# Patient Record
Sex: Female | Born: 1935
Health system: Southern US, Community
[De-identification: ages and names within clinical notes are randomized; demographics above are authoritative.]

## PROBLEM LIST (undated history)

## (undated) DIAGNOSIS — F0390 Unspecified dementia without behavioral disturbance: Secondary | ICD-10-CM

## (undated) DIAGNOSIS — N390 Urinary tract infection, site not specified: Secondary | ICD-10-CM

## (undated) DIAGNOSIS — E785 Hyperlipidemia, unspecified: Secondary | ICD-10-CM

## (undated) DIAGNOSIS — I2510 Atherosclerotic heart disease of native coronary artery without angina pectoris: Secondary | ICD-10-CM

## (undated) DIAGNOSIS — I739 Peripheral vascular disease, unspecified: Principal | ICD-10-CM

## (undated) DIAGNOSIS — I499 Cardiac arrhythmia, unspecified: Secondary | ICD-10-CM

## (undated) DIAGNOSIS — D649 Anemia, unspecified: Secondary | ICD-10-CM

## (undated) DIAGNOSIS — I779 Disorder of arteries and arterioles, unspecified: Secondary | ICD-10-CM

## (undated) DIAGNOSIS — S72142A Displaced intertrochanteric fracture of left femur, initial encounter for closed fracture: Secondary | ICD-10-CM

## (undated) DIAGNOSIS — I080 Rheumatic disorders of both mitral and aortic valves: Secondary | ICD-10-CM

## (undated) DIAGNOSIS — I10 Essential (primary) hypertension: Secondary | ICD-10-CM

## (undated) DIAGNOSIS — A419 Sepsis, unspecified organism: Secondary | ICD-10-CM

## (undated) HISTORY — DX: Sepsis, unspecified organism: N39.0

## (undated) HISTORY — DX: Sepsis, unspecified organism: A41.9

## (undated) HISTORY — DX: Cardiac arrhythmia, unspecified: I49.9

## (undated) HISTORY — DX: Anemia, unspecified: D64.9

## (undated) HISTORY — DX: Rheumatic disorders of both mitral and aortic valves: I08.0

## (undated) HISTORY — DX: Atherosclerotic heart disease of native coronary artery without angina pectoris: I25.10

## (undated) HISTORY — DX: Disorder of arteries and arterioles, unspecified: I77.9

## (undated) HISTORY — DX: Peripheral vascular disease, unspecified: I73.9

## (undated) HISTORY — PX: ABDOMINAL HYSTERECTOMY: SHX81

---

## 2001-05-18 ENCOUNTER — Emergency Department (HOSPITAL_COMMUNITY): Admission: EM | Admit: 2001-05-18 | Discharge: 2001-05-18 | Payer: Self-pay | Admitting: Emergency Medicine

## 2004-01-17 ENCOUNTER — Ambulatory Visit (HOSPITAL_COMMUNITY): Admission: RE | Admit: 2004-01-17 | Discharge: 2004-01-17 | Payer: Self-pay | Admitting: Pulmonary Disease

## 2004-07-07 DIAGNOSIS — I2510 Atherosclerotic heart disease of native coronary artery without angina pectoris: Secondary | ICD-10-CM

## 2004-07-07 HISTORY — DX: Atherosclerotic heart disease of native coronary artery without angina pectoris: I25.10

## 2004-07-07 HISTORY — PX: CORONARY ARTERY BYPASS GRAFT: SHX141

## 2004-11-25 ENCOUNTER — Ambulatory Visit: Payer: Self-pay | Admitting: Cardiology

## 2004-11-28 ENCOUNTER — Ambulatory Visit: Payer: Self-pay | Admitting: Cardiology

## 2004-12-10 ENCOUNTER — Inpatient Hospital Stay (HOSPITAL_COMMUNITY): Admission: AD | Admit: 2004-12-10 | Discharge: 2004-12-17 | Payer: Self-pay | Admitting: *Deleted

## 2004-12-10 ENCOUNTER — Inpatient Hospital Stay (HOSPITAL_BASED_OUTPATIENT_CLINIC_OR_DEPARTMENT_OTHER): Admission: RE | Admit: 2004-12-10 | Discharge: 2004-12-10 | Payer: Self-pay | Admitting: *Deleted

## 2004-12-11 ENCOUNTER — Encounter: Payer: Self-pay | Admitting: Cardiovascular Disease

## 2004-12-11 ENCOUNTER — Ambulatory Visit: Payer: Self-pay | Admitting: Cardiovascular Disease

## 2005-01-02 ENCOUNTER — Ambulatory Visit: Payer: Self-pay | Admitting: Cardiology

## 2005-05-07 ENCOUNTER — Ambulatory Visit: Payer: Self-pay | Admitting: Cardiology

## 2005-11-17 ENCOUNTER — Ambulatory Visit: Payer: Self-pay | Admitting: Cardiology

## 2006-04-06 ENCOUNTER — Ambulatory Visit: Payer: Self-pay | Admitting: Cardiology

## 2007-10-04 ENCOUNTER — Ambulatory Visit: Payer: Self-pay | Admitting: Cardiology

## 2007-10-13 ENCOUNTER — Encounter: Payer: Self-pay | Admitting: Physician Assistant

## 2007-10-13 ENCOUNTER — Encounter: Payer: Self-pay | Admitting: Cardiology

## 2007-10-13 ENCOUNTER — Ambulatory Visit: Payer: Self-pay | Admitting: Cardiology

## 2007-11-11 ENCOUNTER — Encounter: Payer: Self-pay | Admitting: Cardiology

## 2007-11-16 ENCOUNTER — Ambulatory Visit: Payer: Self-pay | Admitting: Cardiology

## 2008-07-25 ENCOUNTER — Ambulatory Visit: Payer: Self-pay | Admitting: Cardiology

## 2008-08-10 ENCOUNTER — Ambulatory Visit: Payer: Self-pay | Admitting: Cardiology

## 2008-08-19 ENCOUNTER — Encounter: Payer: Self-pay | Admitting: Cardiology

## 2008-08-30 ENCOUNTER — Ambulatory Visit: Payer: Self-pay | Admitting: Vascular Surgery

## 2008-11-13 ENCOUNTER — Encounter: Payer: Self-pay | Admitting: Cardiology

## 2008-12-28 ENCOUNTER — Emergency Department (HOSPITAL_COMMUNITY): Admission: EM | Admit: 2008-12-28 | Discharge: 2008-12-28 | Payer: Self-pay | Admitting: Emergency Medicine

## 2009-03-26 DIAGNOSIS — E785 Hyperlipidemia, unspecified: Secondary | ICD-10-CM | POA: Insufficient documentation

## 2009-03-26 DIAGNOSIS — I10 Essential (primary) hypertension: Secondary | ICD-10-CM | POA: Insufficient documentation

## 2009-03-26 DIAGNOSIS — I080 Rheumatic disorders of both mitral and aortic valves: Secondary | ICD-10-CM | POA: Insufficient documentation

## 2009-03-26 DIAGNOSIS — I2510 Atherosclerotic heart disease of native coronary artery without angina pectoris: Secondary | ICD-10-CM

## 2009-03-28 ENCOUNTER — Ambulatory Visit: Payer: Self-pay | Admitting: Vascular Surgery

## 2009-03-29 ENCOUNTER — Encounter: Payer: Self-pay | Admitting: Cardiology

## 2009-04-10 ENCOUNTER — Ambulatory Visit: Payer: Self-pay | Admitting: Cardiology

## 2009-04-10 DIAGNOSIS — I6529 Occlusion and stenosis of unspecified carotid artery: Secondary | ICD-10-CM

## 2009-04-12 ENCOUNTER — Encounter: Payer: Self-pay | Admitting: Cardiology

## 2009-04-13 ENCOUNTER — Encounter: Payer: Self-pay | Admitting: Cardiology

## 2009-04-24 ENCOUNTER — Encounter (INDEPENDENT_AMBULATORY_CARE_PROVIDER_SITE_OTHER): Payer: Self-pay | Admitting: *Deleted

## 2009-08-24 ENCOUNTER — Encounter: Payer: Self-pay | Admitting: Cardiology

## 2009-08-27 ENCOUNTER — Encounter: Payer: Self-pay | Admitting: Cardiology

## 2009-08-28 ENCOUNTER — Encounter: Payer: Self-pay | Admitting: Cardiology

## 2009-08-29 ENCOUNTER — Encounter: Payer: Self-pay | Admitting: Cardiology

## 2010-06-21 ENCOUNTER — Encounter: Payer: Self-pay | Admitting: Cardiology

## 2010-06-21 ENCOUNTER — Encounter: Payer: Self-pay | Admitting: Physician Assistant

## 2010-06-21 ENCOUNTER — Ambulatory Visit: Payer: Self-pay | Admitting: Cardiology

## 2010-06-21 DIAGNOSIS — R5383 Other fatigue: Secondary | ICD-10-CM

## 2010-06-21 DIAGNOSIS — R5381 Other malaise: Secondary | ICD-10-CM | POA: Insufficient documentation

## 2010-06-27 ENCOUNTER — Encounter (INDEPENDENT_AMBULATORY_CARE_PROVIDER_SITE_OTHER): Payer: Self-pay | Admitting: *Deleted

## 2010-07-05 ENCOUNTER — Encounter: Payer: Self-pay | Admitting: Cardiology

## 2010-07-05 ENCOUNTER — Encounter: Payer: Self-pay | Admitting: Physician Assistant

## 2010-07-29 ENCOUNTER — Encounter: Payer: Self-pay | Admitting: Pulmonary Disease

## 2010-08-06 NOTE — Letter (Signed)
Summary: [facility]C DR. Wende Crease  [facility]C DR. Wende Crease   Imported By: Zachary George 04/25/2010 09:31:45  _____________________________________________________________________  External Attachment:    Type:   Image     Comment:   External Document

## 2010-08-08 ENCOUNTER — Ambulatory Visit: Admit: 2010-08-08 | Payer: Self-pay | Admitting: Vascular Surgery

## 2010-08-08 ENCOUNTER — Ambulatory Visit: Payer: Self-pay | Admitting: Vascular Surgery

## 2010-08-08 ENCOUNTER — Other Ambulatory Visit: Payer: Self-pay

## 2010-08-08 NOTE — Letter (Signed)
Summary: Internal Correspondence/ FAXED V VS REQUEST FOR CONSULTATION      Internal Correspondence/ FAXED V VS REQUEST FOR CONSULTATION       Imported By: Dorise Hiss 07/10/2010 09:51:14  _____________________________________________________________________  External Attachment:    Type:   Image     Comment:   External Document

## 2010-08-08 NOTE — Letter (Signed)
Summary: Engineer, materials at [facility]  [address]   [place], [place] [address]   Phone: [phone]  Fax: [phone]        June 27, 2010 MRN: [id number]   Karen Fowler [address] [place], [place]  [address]   Dear [name],  Your test ordered by Selena Batten has been reviewed by your physician (or physician assistant) and was found to be normal or stable. Your physician (or physician assistant) felt no changes were needed at this time.  ____ Echocardiogram  ____ Cardiac Stress Test  __X__ Lab Work  ____ Peripheral vascular study of arms, legs or neck  ____ CT scan or X-ray  ____ Lung or Breathing test  ____ Other:   Thank you.   Hoover Brunette, LPN    Duane Boston, M.D., F.A.C.C. Thressa Sheller, M.D., F.A.C.C. Oneal Grout, M.D., F.A.C.C. Cheree Ditto, M.D., F.A.C.C. Daiva Nakayama, M.D., F.A.C.C. Kenney Houseman, M.D., F.A.C.C. Jeanne Ivan, PA-C

## 2010-08-08 NOTE — Assessment & Plan Note (Signed)
Summary: 6 month fu has dementia   Visit Type:  Follow-up Primary Karen Fowler:  Karen Fowler   History of Present Illness: patient returns to the office for overdue six-month followup. She was last seen here in October 2010, by Dr. Andee Lineman. She presents with her daughter, who lives in [place], and she was concerned that her mother has not had regular medical followup. Of note, however, she was just seen, approximately 2 weeks ago, by her primary care physician, Dr. Juanetta Gosling, in [place]. No labs were ordered.  Clinically, patient denies any chest pain or tachycardia palpitations. Her daughter is concerned that her mother easily "gets tired", and has become much more bedridden lately, or just sitting in a wheelchair. She does not venture out of her home. She appears to have some DOE, when walking with her walker, but otherwise denies orthopnea, PND, or lower extremity edema.      Preventive Screening-Counseling & Management  Alcohol-Tobacco     Smoking Status: quit     Year Quit: 1960  Current Medications (verified): 1)  Simvastatin 20 Mg Tabs (Simvastatin) .... Take One Tablet By Mouth Daily At Bedtime 2)  Lasix 20 Mg Tabs (Furosemide) .... Take 1 Tablet By Mouth Once A Day 3)  Lisinopril 20 Mg Tabs (Lisinopril) .Marland Kitchen.. 1 Tablet By Mouth Once A Day 4)  Nitrostat 0.4 Mg Subl (Nitroglycerin) .... Place 1 Tablet Under Tongue As Directed 5)  Potassium Chloride Cr 10 Meq Cr-Caps (Potassium Chloride) .... Take 1 Capsule By Mouth Once A Day 6)  Aspirin 81 Mg Tbec (Aspirin) .... Take 1 Tablet By Mouth Once A Day 7)  Metoprolol Tartrate 25 Mg Tabs (Metoprolol Tartrate) .... Take 1 Tablet By Mouth Once A Day 8)  Amlodipine Besylate 5 Mg Tabs (Amlodipine Besylate) .... Take 1 Tablet By Mouth Once A Day 9)  Namenda 10 Mg Tabs (Memantine Hcl) .... Take 1 Tablet By Mouth Two Times A Day 10)  Oscal 500/200 D-3 500-200 Mg-Unit Tabs (Calcium Carbonate-Vitamin D) .... Take 1 Tablet By Mouth Once  A Day  Allergies (verified): 1)  ! Pcn  Comments:  Nurse/Medical Assistant: The patient's medication bottles and allergies were reviewed with the patient and were updated in the Medication and Allergy Lists.  Past History:  Past Medical History: Last updated: 04/10/2009 MITRAL REGURGITATION, 0 (MILD) (ICD-396.3) HYPERTENSION, UNSPECIFIED (ICD-401.9) HYPERLIPIDEMIA-MIXED (ICD-272.4) CAD, ARTERY BYPASS GRAFT (ICD-414.04) Dementia Mild cerebrovascular disease 1. Multivessel coronary artery disease.     a.     Status post coronary artery bypass graft in 2006.     b.     Normal LV function. 2. Mild cerebrovascular disease.     a.     EF was 60%, left ventricular artery stenosis in May 2007. 3. History of mild mitral regurgitation. 4. Hypertension. 5. Dyslipidemia. 6. Dementia.   Social History: Smoking Status:  quit  Review of Systems       No fevers, chills, hemoptysis, dysphagia, melena, hematocheezia, hematuria, rash, claudication, orthopnea, pnd, pedal edema. All other systems negative.   Vital Signs:  Patient profile:   77 year old female Height:      64 inches Weight:      151 pounds BMI:     26.01 Pulse rate:   69 / minute BP sitting:   134 / 72  (left arm) Cuff size:   large  Vitals Entered By: Carlye Grippe (June 21, 2010 1:49 PM)  Nutrition Counseling: Patient's BMI is greater than 25 and therefore counseled on weight  management options.  Physical Exam  Additional Exam:  GEN: 13-year-old female, no distress HEENT: NCAT,PERRLA,EOMI NECK: palpable pulses, no bruits; no JVD; no TM LUNGS: CTA bilaterally HEART: RRR (S1S2); no significant murmurs; no rubs; no gallops ABD: soft, NT; intact BS EXT: intact distal pulses; trace-1+ edema SKIN: warm, dry MUSC: no obvious deformity NEURO: A/O (x3)     EKG  Procedure date:  06/21/2010  Findings:      sinus arrhythmia at 65 bpm; normal axis; nonspecific ST changes  Impression &  Recommendations:  Problem # 1:  CAD, ARTERY BYPASS GRAFT (ICD-414.04)  clinically stable, with no current indication of unstable angina pectoris. Last ischemic evaluation was a negative stress test in 2010.  Problem # 2:  CAROTID ARTERY DISEASE (ICD-433.10)  Will arrange a return followup appointment with Dr. Fabienne Bruns in [place], who she last saw in February 2010. Her last carotid Doppler study was in September of that year, revealing 60-79% left ICA, and 40-59% right ICA stenosis. Will defer to him for ongoing monitoring and treatment recommendations.  Orders: EKG w/ Interpretation (93000) T-Comprehensive Metabolic Panel (53664-40347) T-CBC No Diff ([id number]) T-TSH (64332-95188)  Problem # 3:  FATIGUE (ICD-780.79)  Will order extensive blood work with complete metabolic profile, CBC, TSH level.  Problem # 4:  HYPERLIPIDEMIA-MIXED (ICD-272.4)  reassessment of lipid status with FLP/LFTs profile. Of note, patient is on low-dose simvastatin, in conjunction with low dose amlodipine. We'll make final recommendation regarding possible substitution with another statin, following review of her lipid profile.  Problem # 5:  HYPERTENSION, UNSPECIFIED (ICD-401.9) Assessment: Comment Only  Her updated medication list for this problem includes:    Lasix 20 Mg Tabs (Furosemide) .Marland Kitchen... Take 1 tablet by mouth once a day    Lisinopril 20 Mg Tabs (Lisinopril) .Marland Kitchen... 1 tablet by mouth once a day    Aspirin 81 Mg Tbec (Aspirin) .Marland Kitchen... Take 1 tablet by mouth once a day    Metoprolol Tartrate 25 Mg Tabs (Metoprolol tartrate) .Marland Kitchen... Take 1 tablet by mouth once a day    Amlodipine Besylate 5 Mg Tabs (Amlodipine besylate) .Marland Kitchen... Take 1 tablet by mouth once a day  Orders: T-Comprehensive Metabolic Panel [phone]) T-CBC No Diff ([id number]) T-TSH ([id number])  Patient Instructions: 1)  Dr. Fabienne Bruns - follow up appointment for carotid artery disease 2)  Labs:  CBC, CBC, TSH 3)   Follow up in  4 months Prescriptions: POTASSIUM CHLORIDE CR 10 MEQ CR-CAPS (POTASSIUM CHLORIDE) Take 1 capsule by mouth once a day  #30 Each x 6   Entered by:   Hoover Brunette, LPN   Authorized by:   Lewayne Bunting, MD, [facility]   Signed by:   Hoover Brunette, LPN on 62/37/6283   Method used:   Electronically to        [facility] # [phone]* (retail)       [address]       [place], [place]  [address]       Ph: [phone] or [phone]       Fax: [phone]   RxID:   [id number] LISINOPRIL 20 MG TABS (LISINOPRIL) 1 tablet by mouth once a day  #30 Each x 6   Entered by:   Hoover Brunette, LPN   Authorized by:   Lewayne Bunting, MD, [facility]   Signed by:   Hoover Brunette, LPN on 25/85/2778   Method used:   Electronically to        [facility]  [address] # [phone]* (retail)       [address]       [place], [place]  [address]       Ph: [phone] or [phone]       Fax: [phone]   RxID:   [id number] LASIX 20 MG TABS (FUROSEMIDE) Take 1 tablet by mouth once a day  #30 Each x 6   Entered by:   Hoover Brunette, LPN   Authorized by:   Lewayne Bunting, MD, [facility]   Signed by:   Hoover Brunette, LPN on 36/64/4034   Method used:   Electronically to        [facility] # [phone]* (retail)       [address]       [place], [place]  [address]       Ph: [phone] or [phone]       Fax: [phone]   RxID:   [id number] SIMVASTATIN 20 MG TABS (SIMVASTATIN) Take one tablet by mouth daily at bedtime  #30 x 6   Entered by:   Hoover Brunette, LPN   Authorized by:   Lewayne Bunting, MD, [facility]   Signed by:   Hoover Brunette, LPN on 62/83/1517   Method used:   Electronically to        [facility] # [phone]* (retail)       [address]       [place], [place]  [address]       Ph: [phone] or [phone]       Fax: [phone]   RxID:   [id number]

## 2010-08-08 NOTE — Miscellaneous (Signed)
Summary: Orders Update - [facility]  Clinical Lists Changes  Orders: Added new Referral order of [facility] Referral ([facility] Ref) - Signed 

## 2010-08-08 NOTE — Letter (Signed)
Summary: [facility] D/C DR. Wende Crease  [facility]C DR. Wende Crease   Imported By: Zachary George 06/21/2010 14:15:48  _____________________________________________________________________  External Attachment:    Type:   Image     Comment:   External Document

## 2010-11-19 NOTE — Assessment & Plan Note (Signed)
[facility]                          [facility] OFFICE NOTE   Karen, Fowler                    MRN:          [id number]  DATE:10/04/2007                            DOB:          1926/01/17    PRIMARY CARDIOLOGIST:  Learta Codding, MD   REASON FOR VISIT:  Annual follow-up.   Karen Fowler returns to our clinic after being last seen here in October  2007.  She has a history of multivessel coronary artery disease, status  post CABG in June 2006.  She was treated for postop PAF with amiodarone,  which was stopped by Dr. Andee Lineman during a postop follow-up here in  November 2006.  She was not discharged on Coumadin, however.   Clinically, Karen Fowler denies any symptoms of chest pain, congestive  heart failure, or tachypalpitations.   I had previously noted that she would be due for surveillance carotid  Dopplers in 1 year.  I also noted that she was not on a lipid-lowering  agent at that time.  On her part, however, the patient also did not  follow up with [place] in 1 year, as I had recommended.   My only medication adjustment at time of her last visit was to cut back  on aspirin to 81 mg daily.   Electrocardiogram today reveals sinus bradycardia at 57 bpm with normal  axis and nonspecific ST abnormalities.   CURRENT MEDICATIONS:  1. Metoprolol 25 mg b.i.d.  2. Lisinopril 20 mg daily.  3. Furosemide 20 mg daily.  4. Potassium chloride 10 mEq daily.  5. Xanax 0.5 mg nightly.  6. Aspirin 81 mg daily.  7. Aricept 10 mg daily.   The patient was recently instructed to present in follow-up, given that  she needed refill of her furosemide.   PHYSICAL EXAMINATION:  Blood pressure 128/73, pulse 57, regular, weight  137.  GENERAL:  A 77 year old female sitting upright in no distress.  HEENT:  Normocephalic, atraumatic.  NECK:  Palpable carotid pulses with soft bilateral bruits (left greater  right); no JVD.  LUNGS:  Clear to auscultation in  all fields.  HEART:  Regular rate and rhythm (S1, s2), no significant murmurs.  ABDOMEN:  Soft, nontender, with intact bowel sounds.  EXTREMITIES:  Palpable pulses with no pedal edema.  NEUROLOGIC: Flat affect, but no focal deficits.   IMPRESSION:  1. Multivessel coronary artery disease.      a.     Status post four-vessel coronary artery bypass graft, June       2006.      b.     Normal LVF by cardiac catheterization.      c.     Postoperative paroxysmal atrial fibrillation, treated with       amiodarone.  2. Mild cerebrovascular disease.      a.     A 40-60% left internal carotid artery stenosis, May 2007.  3. History of mild mitral regurgitation.  4. Hypertension.  5. Hyperlipidemia, not on a statin.  6. Early dementia.   PLAN:  1. Continue current medication regimen, except for Lasix.  The patient  presents with no clinical symptoms suggestive of congestive heart      failure.  She also has no history of lower extremity edema and has      known normal LVF by cardiac catheterization.  She is to also stop      taking supplemental potassium, as well.  2. A 2-D echocardiogram for reassessment of left ventricular function,      but primarily for reassessment of severity of mitral regurgitation.      Her cardiac catheterization suggested that this was moderate.      However, discharge records indicate that a 2-D echocardiogram,      prior to bypass surgery, showed no significant mitral      regurgitation.  3. Surveillance carotid Dopplers.  Once again, she has evidence of      soft bilateral bruits by physical examination.  Her last study was      in May 2007, showing mild to moderate disease on the left.  4. Initiate lipid-lowering treatment with simvastatin 40 mg nightly.      The patient will need a baseline FLP and a repeat study in 12      weeks.  5. Schedule return clinic follow-up with myself and Dr. Andee Lineman in 1      month, for review of study results and further  recommendations.  Of      note, the patient is now nearly 3 years out from undergoing      surgical revascularization.  She has not had any subsequent      ischemic workup but, in the absence of any current complaint of      clinical symptoms, I am disinclined to order a stress test at this      time.  We will review this when she returns for her scheduled      follow-up.     Gene Serpe, PA-C  Electronically Signed      Learta Codding, MD,FACC  Electronically Signed   GS/MedQ  DD: 10/04/2007  DT: 10/05/2007  Job #: [id number]   cc:   Ramon Dredge L. Juanetta Gosling, M.D.

## 2010-11-19 NOTE — Assessment & Plan Note (Signed)
OFFICE VISIT   Karen Fowler, Karen Fowler  DOB:  Mar 10, 1926                                       08/30/2008  ZOXWR#:[id number]   The patient is an 77 year old female referred by Dr. Andee Lineman for  asymptomatic carotid stenosis.  She denies any history of stroke, TIA or  amaurosis.  She does have a history of mild to moderate dementia.   Her atherosclerotic risk factors include coronary artery disease.  She  underwent coronary bypass grafting in 2006.  She also has a history of  hypertension and elevated cholesterol.  She denies tobacco use.   PAST SURGICAL HISTORY:  Remarkable for coronary artery bypass grafting  in 2006.  She had a hysterectomy.   FAMILY HISTORY:  Unremarkable.   SOCIAL HISTORY:  She is widowed.  She is a nonsmoker, non-consumer of  alcohol.   REVIEW OF SYSTEMS:  She has had some loss of appetite and weight loss.  She has had some loss of appetite and weight loss recently.  However,  the patient denies any pain with eating and states that she just has not  been hungry.  CARDIAC:  She has dyspnea with exertion.  PULMONARY/GI/VASCULAR/NEUROLOGIC/HEMATOLOGIC:  Are otherwise negative.  RENAL:  She has urinary frequency.  ORTHOPEDIC:  She has multiple joint arthritis pain.  PSYCHIATRIC:  She has some mild anxiety and depression.  ENT:  She has had some decline in her eyesight recently.   MEDICATIONS:  1. Aricept 10 mg once a day.  2. Furosemide 20 mg once a day.  3. Amlodipine 5 mg once a day.  4. Potassium chloride 10 mEq once a day.  5. Namenda 5 mg two times a day.  6. Simvastatin 20 mg nightly.  7. Lisinopril 20 mg once a day.  8. Metoprolol 25 mg 2 daily.  9. Premarin 0.65 mg once a day.   She is allergic to penicillin, which causes a rash.   PHYSICAL EXAM:  Vital Signs:  Blood pressure is 117/66 in the left arm,  133/64 in the right arm, pulse is 60 and regular.  HEENT:  Unremarkable.  Neck:  Has 2+ carotid pulses.  There is a faint  left carotid bruit.  Chest:  Clear to auscultation.  Cardiac:  Regular rate and rhythm  without murmur.  Abdomen:  Soft, nontender, nondistended with no mass.  Extremities:  She has 2+ femoral and 1+ popliteal pulses bilaterally.  She has no palpable pedal pulses.  She has no ulcerations on the feet.   She had a carotid duplex exam performed at [facility] on August 10, 2008.  This is reviewed today.  Peak systolic velocity on the left  side was 226 cm/s.  On the right side it was 130 cm/s.  This corresponds  to a 60% to 80% stenosis on the left and a 40% to 60% stenosis on the  right.  She had antegrade vertebral flow bilaterally.   In summary, the patient has evidence of a moderate left carotid artery  stenosis and a mild right carotid artery stenosis.  These both appear to  be asymptomatic.  I believe the best management for this is continued  risk factor modification with aspirin therapy and risk reduction of her  elevated cholesterol and hypertension and management of her coronary  artery disease.  She will need a repeat  carotid duplex exam in 6 months'  time to see if she has had any progression of the lesion.  If she  develops any symptoms of TIA, amaurosis or stroke prior to this, she  will return sooner for follow-up.  This was discussed with the patient's  daughters today.   Janetta Hora. Fields, MD  Electronically Signed   CEF/MEDQ  D:  08/31/2008  T:  08/31/2008  Job:  [id number]   cc:   Learta Codding, MD,FACC

## 2010-11-19 NOTE — Assessment & Plan Note (Signed)
[facility]                          [facility] OFFICE NOTE   Karen Fowler, Karen Fowler                    MRN:          [id number]  DATE:11/16/2007                            DOB:          07-05-1926    REFERRING PHYSICIAN:  Ramon Dredge L. Juanetta Gosling, M.D.   HISTORY OF PRESENT ILLNESS:  The patient is a 77 year old female with a  history of coronary artery disease status post coronary bypass graft.  The patient has normal LV function.  She has a history of postoperative  paroxysmal atrial fibrillation previously treated with amiodarone but  now discontinued.  The patient has been doing well.  She reports no  chest pain or shortness of breath.  She did have some lower extremity  edema which required her to go to the emergency room.  She was off taken  off Lasix by Gene Serpe recently, likely contributing to her worsening  edema.  She also had a follow-up echocardiogram done to rule out a  significant mitral regurgitation.  MR was basically undetectable.  Her  LV function was normal.  The patient is now doing well and her edema has  improved.  She was felt have also cellulitis and was given 2  antibiotics, cephalexin and sulfamethoxazole.   MEDICATIONS:  Simvastatin, cephalexin, sulfamethoxazole, piroxicam,  potassium, Premarin, Norvasc, metoprolol, lisinopril,  Xanax,  multivitamin, B12, Os-Cal, aspirin, Aricept.   PHYSICAL EXAMINATION:  VITAL SIGNS:  Blood pressure 123/61, heart rate  56, 140 pounds.  NECK EXAM:  Normal carotid upstroke.  No carotid bruits.  LUNGS:  Clear breath sounds bilaterally.  HEART:  Regular rate and rhythm.  Normal S1 and S2.  No murmurs, rubs or  gallops.  ABDOMEN:  Soft, nontender.  No rebound or guarding.  Good bowel sounds.  EXTREMITY EXAM:  No cyanosis, clubbing or edema.   PROBLEM LIST:  1. Multivessel coronary disease.      a.     Status post coronary artery bypass grafting.      b.     Normal left ventricular  function.  2. Mild cerebrovascular disease.      a.     40% to 60% left internal carotid artery stenosis in May of       2007.  Still less than 70% in followup.  3. History of mild mitral regurgitation, resolved.  4. Hypertension.  5. Dyslipidemia.  Now on statin therapy.  6. Dementia.   PLAN:  1. The patient is doing remarkably well.  Will start her back on Lasix      20 mg p.o. daily.  2. I have discussed with her the results of her carotid Doppler study      and echocardiogram      study.  3. The patient can in follow up with [place] in 6 months.     Karen Codding, MD,FACC  Electronically Signed    GED/MedQ  DD: 11/16/2007  DT: 11/16/2007  Job #: [id number]   cc:   Ramon Dredge L. Juanetta Gosling, M.D.

## 2010-11-19 NOTE — Procedures (Signed)
CAROTID DUPLEX EXAM   INDICATION:  Followup carotid artery disease.   HISTORY:  Diabetes:  No.  Cardiac:  CABG.  Hypertension:  No.  Smoking:  No.  Previous Surgery:  Open heart surgery.  CV History:  Asymptomatic.  Amaurosis Fugax No, Paresthesias No, Hemiparesis No                                       RIGHT               LEFT  Brachial systolic pressure:         130                 122  Brachial Doppler waveforms:         WNL                 WNL  Vertebral direction of flow:        Antegrade           Antegrade  DUPLEX VELOCITIES (cm/sec)  CCA peak systolic                   115                 101  ECA peak systolic                   153                 571  ICA peak systolic                   153                 235  ICA end diastolic                   45                  52  PLAQUE MORPHOLOGY:                  Calcified           Calcified  PLAQUE AMOUNT:                      Moderate            Moderate/severe  PLAQUE LOCATION:                    ICA/ECA/bifurcation ICA/ECA/CCA   IMPRESSION:  1. Right internal carotid artery shows evidence of 40-59% stenosis.  2. Left internal carotid artery shows evidence of 60-79% stenosis.  3. Left external carotid artery high-grade stenosis.  4. Internal carotid artery velocities appear stable from study done      08/10/2008 at different facility.   ___________________________________________  Janetta Hora. Fields, MD   AS/MEDQ  D:  03/28/2009  T:  03/29/2009  Job:  [id number]

## 2010-11-19 NOTE — Assessment & Plan Note (Signed)
[facility]                          [facility]   Karen Fowler, Karen Fowler                    MRN:          [id number]  DATE:07/25/2008                            DOB:          October 16, 1925    HISTORY OF PRESENT ILLNESS:  This patient is an 77 year old female with  history of coronary artery disease status post coronary artery bypass  grafting in 2006.  The patient is doing remarkably well.  She reports no  chest pain, shortness of breath, orthopnea, or PND.  She has no syncope  or palpitations.  There is apparently some mitral regurgitation, but  this was early not detected by her physical examination and the last  echo also was found to be mild.   MEDICATIONS:  1. Simvastatin 20 mg p.o. daily.  2. Namenda 5 mg p.o. b.i.d.  3. Premarin 0.625 mg daily.  4. Aricept 10 mg p.o. daily.  5. Lisinopril 20 mg p.o. daily.  6. Metoprolol 25 mg p.o. b.i.d.  7. Amlodipine 5 mg a day.  8. Potassium 10 mg p.o. daily.  9. Furosemide 20 mg p.o. daily.  10.Aspirin 81 mg p.o. daily.   PHYSICAL EXAMINATION:  VITAL SIGNS:  Blood pressure is 118/60, heart  rate 54, and weight is 140 pounds.  NECK:  Normal carotid upstroke.  Soft bilateral carotid bruits.  LUNGS:  Clear breath sounds bilaterally.  HEART:  Regular rate and rhythm.  Normal S1, S2.  No murmur, rubs, or  gallops.  ABDOMEN:  Soft.  EXTREMITIES:  No cyanosis, clubbing, or edema.   PROBLEMS:  1. Multivessel coronary artery disease.      a.     Status post coronary artery bypass graft in 2006.      b.     Normal LV function.  2. Mild cerebrovascular disease.      a.     EF was 60%, left ventricular artery stenosis in May 2007.  3. History of mild mitral regurgitation.  4. Hypertension.  5. Dyslipidemia.  6. Dementia.   PLAN:  1. The patient is due for a followup Cardiolite study, it has been      more than 3 years after her surgery, we will schedule this.  2. The patient also need  to follow up carotid Dopplers.  3. From the medication perspective no changes have been made, and      blood pressure is well controlled.  Lipid panel can be followed by      Dr. Juanetta Gosling.     Learta Codding, MD,FACC  Electronically Signed    GED/MedQ  DD: 07/25/2008  DT: 07/26/2008  Job #: [id number]   cc:   Ramon Dredge L. Juanetta Gosling, M.D.

## 2010-11-19 NOTE — Assessment & Plan Note (Signed)
[facility]                          [facility]   Karen Fowler, Karen Fowler                    MRN:          [id number]  DATE:10/04/2007                            DOB:          Dec 03, 1925    DATE OF VISIT:  October 04, 2007.  DICTATION ENDED HERE     Gene Serpe, PA-C     GS/MedQ  DD: 10/04/2007  DT: 10/04/2007  Job #: [id number]

## 2010-11-22 NOTE — Op Note (Signed)
NAMEDEZHANE, STATEN NO.:  [id number]   MEDICAL RECORD NO.:  [id number]          PATIENT TYPE:  INP   LOCATION:  2304                         FACILITY:  [facility]   PHYSICIAN:  Kaylyn Layer. Michelle Piper, M.D.   DATE OF BIRTH:  03/17/1926   DATE OF PROCEDURE:  12/12/2004  DATE OF DISCHARGE:                                 OPERATIVE REPORT   TYPE OF REPORT:  Transesophageal echocardiographic probe placement and  monitoring during coronary artery bypass grafting.   SURGEON:  Kaylyn Layer. Michelle Piper, M.D.   BODY OF REPORT:  I was consulted by Dr. Cornelius Moras to place the transesophageal  Echo probe into Ms. Brooking for evaluation and monitoring of her mitral  regurgitation during coronary artery bypass grafting.   After uneventful induction of anesthesia and endotracheal intubation, the  transesophageal Echo probe was easily placed into the stomach.  Initial  short axis view of the left ventricle showed normal left ventricle in all  quadrants with no wall motion abnormalities.  Turning the four-chamber view,  I could visualize the mitral valve well.  The valve tips coapted normally,  and valve structures looked normal in all views, and on placing Colorflow  Doppler across the valve, two distinct jets of 1-2+ mitral regurgitation  could be seen, although best seen in the 30-degree view and the 160-degree  view.  The left atrium was normal size, however.  Flow into the pulmonary  veins was forward.  The intra-atrial septum was normal.  Turning to the  aortic valve, the left ventricular outflow tract was normal.  The aortic  valve was trileaflet.  On placing the Colorflow Doppler across the valve in  the longitudinal view, there was no evidence of aortic insufficiency or  aortic stenosis.  The right side of the heart was normal.  There was a trace  of tricuspid regurgitation with the pulmonary artery catheter present.   The patient was placed on cardiopulmonary bypass by Dr. Cornelius Moras and underwent  coronary artery bypass grafting.  Immediately prior to discontinuation of  cardiopulmonary bypass, the ventricle looked unchanged.  There were no wall  motion abnormalities.  Cardiopulmonary bypass was discontinued using minimal  inotropes, and short axis view of the left ventricle showed unchanged from  preoperatively, normal left ventricle with no wall motion abnormalities.  Turning to the mitral valve, structurally it looked normal.  On placing  Colorflow Doppler across the valve, there was about 1+ mitral regurgitation  with seemingly some improvement as opposed to preoperatively.  Once again,  two distinct jets could be seen in the 30-degree and the 160-degree views  best.  The left atrium was normal, and the rest of the exam was unchanged  from preoperatively.   At the end of the procedure, the transesophageal Echo probe was removed from  the patient uneventfully, and she was taken to the intensive care unit  intubated and in stable condition.      KDO/MEDQ  D:  12/12/2004  T:  12/12/2004  Job:  [id number]   cc:   Anesthesia Department

## 2010-11-22 NOTE — Assessment & Plan Note (Signed)
[facility]                            [facility]   MEGGEN, SPAZIANI                    MRN:          [id number]  DATE:04/06/2006                            DOB:          22-Nov-1925    REASON FOR VISIT:  A scheduled six-month followup.   HISTORY OF PRESENT ILLNESS:  Ms. Karen Fowler is a 78 year old female, status  post coronary artery bypass graft surgery with a history of postoperative  atrial fibrillation and mild cerebrovascular disease, who now returns for a  scheduled followup.   Since last seen here in the clinic in May of this year, the patient reports  no interim development of any signs/symptoms suggestive of unstable angina  pectoris or congestive heart failure.  She also denies any tachy  palpitations.  The patient states that she feels good.  She lives alone,  but has assistance via her grandchildren, who see to it that she takes her  daily medications.   The patient denies any history of tobacco smoking.   CURRENT MEDICATIONS:  1. Coated aspirin 325 mg daily.  2. Furosemide 20 mg daily.  3. Kay-Ciel 10 mg daily.  4. Xanax 0.5 mg q.h.s.  5. Lisinopril 20 mg daily.  6. Metoprolol 25 mg b.i.d.  7. Norvasc 5 mg daily.  8. Premarin.   PHYSICAL EXAMINATION:  VITAL SIGNS:  Blood pressure 140/58, pulse 58 and  regular.  Weight 151 pounds.  NECK:  Soft, bilateral carotid bruits, but otherwise intact pulses.  LUNGS:  Clear to auscultation in all fields.  HEART:  A regular rate and rhythm.  S1 and S2.  No significant murmurs.  ABDOMEN:  Soft, nontender.  EXTREMITIES:  Palpable distal pulses with trace pedal edema.  NEUROLOGIC:  A flat affect but no focal deficits.   IMPRESSION:  1. Coronary artery disease - stable      a.     Four-vessel coronary artery bypass graft surgery in June 2006.      b.     Normal left ventricular function by catheterization.      c.     Postoperative paroxysmal atrial fibrillation.  2.  Hypertension.  3. Hyperlipidemia      a.     Followed by Dr. Ramon Dredge L. Hawkins.  4. Mild cerebrovascular disease      a.     A 40%-60% left internal carotid artery stenosis in May 2007.  5. History of mild mitral regurgitation.   PLAN:  I instructed [name] to remain on her current medication regimen,  but to cut back on aspirin to 81 mg daily as a maintenance dose.  Of note,  she is currently not on a lipid-lowering agent.  I will defer to Dr. Juanetta Gosling  regarding whether or not she to initiate this, with a recommended LDL goal  of 70 or less.   We will plan on having the patient resume clinic followup with Dr. Learta Codding in one year.  The patient will also need repeat carotid Dopplers in  approximately one year as well.      ______________________________  Gene  Serpe, PA-C    ______________________________  Learta Codding, MD,FACC    GS/MedQ  DD:  04/06/2006  DT:  04/07/2006  Job #:  [id number]   cc:   Ramon Dredge L. Juanetta Gosling, M.D.

## 2010-11-22 NOTE — Cardiovascular Report (Signed)
NAMEMarland Kitchen  Karen Fowler, Karen Fowler NO.:  [id number]   MEDICAL RECORD NO.:  [id number]          PATIENT TYPE:  OIB   LOCATION:  6501                         FACILITY:  [facility]   PHYSICIAN:  Carole Binning, M.D. LHCDATE OF BIRTH:  05-Jun-1926   DATE OF PROCEDURE:  12/10/2004  DATE OF DISCHARGE:                              CARDIAC CATHETERIZATION   PROCEDURE PERFORMED:  Left heart catheterization with coronary angiography  and left ventriculography.   INDICATION:  The patient is a 77 year old woman who recently presented to  [facility] with prolonged chest pain. She ruled out for myocardial  infarction. A stress nuclear study revealed inferior ischemia. She was thus  referred for an outpatient cardiac catheterization.   PROCEDURE NOTE:  A 4-French sheath was placed in the right femoral artery.  Coronary angiography was performed with standard Judkins 4-French catheters.  Left ventriculography was performed with an angled pigtail catheter.  Contrast was Omnipaque. Of note, the patient was complaining of left arm  pain prior to beginning the catheterization. This persisted throughout the  catheterization and was unchanged at the end of the procedure. There were no  complications.   RESULTS:  A. Hemodynamics: Left ventricular pressure 168/9, aortic pressure  162/62. There is no aortic valve gradient.  B. Left ventriculogram: Wall motion is normal, ejection fraction estimated  60%. There is 2-3+ moderate mitral regurgitation.  C. Coronary arteriography:  1.  The left main is very short with dual ostia at the left anterior      descending artery and the left circumflex coronary artery. There is      moderate calcification in the proximal portion of these vessels.  2.  Left anterior descending artery has a 75% stenosis in the proximal      vessel, 80% in the mid vessel, and 80% stenosis in the distal vessel.      The LAD gives rise to a large diagonal branch. In the  proximal portion      of this diagonal branch, there is a 90% stenosis, and in the distal      portion, there is a 90% stenosis.  3.  The left circumflex is a large codominant vessel. In the distal      circumflex, there is a diffuse 70% stenosis extending across the origin      of a large third obtuse marginal branch. The circumflex gives rise to      small first obtuse marginal, a normal-sized second obtuse marginal      branch, and a large third obtuse marginal branch. There is an 80%      stenosis in the ostium of the third obtuse marginal branch. The distal      circumflex also gives rise to a small posterolateral branch.  4.  The right coronary artery is a codominant vessel. There is a 99%      stenosis in the ostium of the right coronary artery with heavy      calcification at that site. In the proximal mid vessel, there is a      diffuse 70% stenosis. In the distal vessel, there  is a diffuse 20%      stenosis. The distal right coronary artery ends as a small bifurcating      posterior descending artery.   IMPRESSION:  1.  Normal left ventricular systolic function with moderate mitral      regurgitation.  2.  Three-vessel coronary artery disease with particularly critical disease      in the ostium of the right coronary artery.   PLAN:  The patient will be admitted to the hospital and started on  intravenous heparin and nitroglycerin. We will obtain an echocardiogram to  further assess her mitral regurgitation. Cardiovascular surgery will be  consulted to evaluate for coronary artery bypass surgery.       MWP/MEDQ  D:  12/10/2004  T:  12/10/2004  Job:  [id number]   cc:   Ramon Dredge L. Juanetta Gosling, M.D.  [address]  [place]  [place] [address]  Fax: [phone]   [facility]  [place], [place]

## 2010-11-22 NOTE — Discharge Summary (Signed)
NAMECHEVI, LIM NO.:  [id number]   MEDICAL RECORD NO.:  [id number]          PATIENT TYPE:  INP   LOCATION:  2017                         FACILITY:  [facility]   PHYSICIAN:  Salvatore Decent. Cornelius Moras, M.D. DATE OF BIRTH:  Mar 26, 1926   DATE OF ADMISSION:  12/10/2004  DATE OF DISCHARGE:  12/17/2004                                 DISCHARGE SUMMARY   PRIMARY ADMITTING DIAGNOSIS:  Chest pain/   ADDITIONAL/DISCHARGE DIAGNOSES:  1.  Severe three-vessel coronary artery disease  2.  Mild mitral regurgitation.  3.  Hypertension.  4.  Hyperlipidemia.  5.  Postoperative atrial fibrillation.  6.  Right forearm thrombophlebitis.   PROCEDURES PERFORMED:  1.  Cardiac catheterization.  2.  Coronary artery bypass grafting x 4 (left internal mammary artery to the      distal LAD, saphenous vein graft to the first diagonal, saphenous vein      graft to the second circumflex marginal, saphenous vein graft to the      distal right coronary artery).  3.  Endoscopic vein harvest from bilateral thighs.   HISTORY:  The patient is a 77 year old female with no previous history of  coronary artery disease. Around two weeks prior to this admission, she  developed a sustained episode of substernal chest pain which was suspicious  for angina. She was evaluated at [facility] where she ruled out an  acute myocardial infarction. She subsequently underwent a stress nuclear  study that demonstrated coronary ischemia. She was seen as outpatient by Dr.  Andee Lineman in [place], [place], and was brought in for an elective  cardiac catheterization on December 10, 2004. Findings at the time of cardiac  catheterization showed left main coronary disease with severe three-vessel  coronary disease and normal left ventricular function. She also had mild  mitral regurgitation. Because of her significant disease, she was admitted  for further cardiac workup and cardiothoracic surgery consultation.   HOSPITAL COURSE:  A CVTS consult was obtained and the patient was seen by  Dr. Tressie Stalker. A 2-D echocardiogram was also performed and this  confirmed the presence of normal left ventricular function and showed no  significant mitral regurgitation. Based on her catheterization findings and  her echocardiogram, Dr. Cornelius Moras recommended proceeding with elective coronary  revascularization at this time. He explained the risks, benefits and  alternatives of the procedure to the patient and her family and they agreed  to proceed. She was taken to the operating room on December 12, 2004, and  underwent CABG x 4 as described in detail above. Intraoperative TEE showed  mild 1+ mitral regurgitation. She tolerated the procedure well and was  transferred to the SICU in stable condition. She was able to be extubated  shortly after surgery. She was hemodynamically stable and doing well on  postoperative day #1. At that time, her hemodynamic monitoring devices and  chest tubes were removed and she was transferred to the floor. She was  started on gentle diuretics and beta blocker therapy. She developed atrial  fibrillation with rates in the teens-130s. She was started on an amiodarone  drip  and converted to normal sinus rhythm. She was subsequently switched to  a p.o. dose of amiodarone. Overall she has done very well postoperatively.  Her blood pressure has slowly been trending upward and she has been  restarted on her home dose of ACE inhibitor, as well as her calcium channel  blocker. She has remained in normal sinus rhythm and her rate has been  stable. She has remained afebrile and other vital signs have been stable.  She has diuresed back down to below her preoperative weight and her  diuretics have now been discontinued. She also developed a mild right  forearm thrombophlebitis at an old IV site and was started on Keflex and  heat to this area topically. On physical examination, she is maintaining   normal sinus rhythm, her lungs are clear and her surgical incision sites are  all healing well. Her most recent laboratories showed a hemoglobin of 10.8,  hematocrit 31.2, platelets 121, white count 11.1, sodium 131, potassium 3.7,  which has been supplemented, BUN 14 and creatinine 0.9. She has a CBC and  basic metabolic panel ordered for the morning of December 17, 2004. She has been  ambulating in the halls without difficulty. She is maintaining O2  saturations of greater than 90% on room air. She is tolerating a regular  diet and is having normal bowel and bladder function. It is felt that if she  continues to remain stable over the next 24-48 hours, she hopefully will be  ready for discharge home. Once again, laboratories have been ordered for the  morning of December 17, 2004, and these will be checked and evaluated prior to a  decision regarding her discharge. Discharge medications are as follows.   DISCHARGE MEDICATIONS:  1.  Enteric-coated aspirin 325 mg daily.  2.  Lopressor 25 mg b.i.d.  3.  Prinivil 20 mg daily.  4.  Amiodarone 200 mg b.i.d.  5.  Keflex 5 mg t.i.d. x 1 week.  6.  Norvasc 5 mg daily.  7.  Premarin 0.65 mg daily.  8.  Alprazolam 0.5 mg t.i.d.  9.  Ultram 50-100 mg q.4-6h. p.r.n. for pain.   DISCHARGE INSTRUCTIONS:  She is asked to refrain from driving, heavy lifting  or strenuous activity. She may continue ambulating daily and using her  incentive spirometer. She may shower daily and clean her incisions with soap  and water. She will continue a low-fat, low-sodium diet.   DISCHARGE FOLLOWUP:  A Home health nurse been arranged for cardiac  restorative re. She will follow up with Dr. Andee Lineman in two weeks and will  have a chest x-ray at that visit. She will see Dr. Cornelius Moras in three weeks and  our office will call to arrange this appointment. She will need to bring her  chest x-ray to this appointment for Dr. Cornelius Moras to review. In the interim, if she experiences any problems  or has questions, she will contact our office  immediately.       GC/MEDQ  D:  12/16/2004  T:  12/16/2004  Job:  [id number]   cc:   Learta Codding, M.D. [facility]   Oneal Deputy. Juanetta Gosling, M.D.  [address]  [place]  [place] [address]  Fax: [phone]

## 2010-11-22 NOTE — Op Note (Signed)
Karen Fowler, Karen Fowler NO.:  [id number]   MEDICAL RECORD NO.:  [id number]          PATIENT TYPE:  INP   LOCATION:  2304                         FACILITY:  [facility]   PHYSICIAN:  Salvatore Decent. Cornelius Moras, M.D. DATE OF BIRTH:  March 30, 1926   DATE OF PROCEDURE:  12/12/2004  DATE OF DISCHARGE:                                 OPERATIVE REPORT   PREOPERATIVE DIAGNOSIS:  Left main disease, three-vessel coronary artery  disease, mild mitral regurgitation, unstable angina.   POSTOPERATIVE DIAGNOSIS:  Left main disease, three-vessel coronary artery  disease, mild mitral regurgitation, unstable angina.   PROCEDURE:  Median sternotomy for coronary artery bypass grafting x4 (left  internal mammary artery to distal left anterior descending coronary artery,  saphenous vein graft to first diagonal branch and saphenous vein graft to  second circumflex marginal branch, saphenous vein graft to distal right  coronary artery, endoscopic saphenous vein harvest from bilateral thighs).   SURGEON:  Dr. Purcell Nails.   ASSISTANT:  Ms. Pecola Leisure.   ANESTHESIA:  General.   BRIEF CLINICAL NOTE:  The patient is a 77 year old female from [place], [place], with no previous history of coronary artery disease. Two weeks  prior to hospital admission, the patient sustained a prolonged episode of  substernal chest pain suspicious for angina. She was evaluated that [facility] where she ruled out for acute myocardial infarction. She  subsequently underwent a stress nuclear study that demonstrated coronary  ischemia. She was brought in for elective cardiac catheterization on June6,  2006. Findings at the time of catheterization demonstrate left main disease  with severe three-vessel coronary artery disease and normal left ventricular  function. There is mild mitral regurgitation. Follow-up 2-D echocardiogram  confirms the presence of normal left ventricular function and no significant  mitral regurgitation.   The patient and family have been counseled at length regarding the  indications and potential benefits of coronary artery bypass grafting.  Alternative treatment strategies have been discussed. They understand and  accept all associated risks of surgery including, but not limited to, risk  of death, stroke, myocardial infarction, congestive heart failure,  respiratory failure, pneumonia, bleeding requiring blood transfusion,  arrhythmia, infection, and recurrent coronary artery disease. They also  understand the risk that the patient may develop postoperative confusion or  delirium. She has mild dementia at baseline that has progressed over the  last couple of years. Under the circumstances, this may effect her  postoperative recovery. It is possible that even without complication, the  patient may ultimately require temporary placement in a nursing facility  depending on her physical recovery. The patient and family's questions have  been answered at length. They desired to proceed with surgery as described.   OPERATIVE FINDINGS:  1.  Normal left ventricular function.  2.  Mild (1+) mitral regurgitation.  3.  Small caliber but good-quality left internal mammary artery conduit for      bypass grafting.  4.  Small caliber and fair to poor quality saphenous vein conduit for      grafting.   OPERATIVE NOTE IN DETAIL:  The patient  is brought to the operating room on  the above-mentioned date and central monitoring is established by the  anesthesia service under the care and direction of Dr. Arta Bruce.  Specifically, a Swan-Ganz catheter is placed through the right internal  jugular approach. A radial arterial line is placed. Intravenous antibiotics  are administered. Following induction with general endotracheal anesthesia,  a Foley catheter is placed. The patient's chest, abdomen, both groins, and  both lower extremities are prepared, draped in sterile  manner.   Baseline transesophageal echocardiogram is performed by Dr. Michelle Piper. This  demonstrates normal left ventricular function. There is mild (1+) mitral  regurgitation. This consists of two small central jets of mitral  regurgitation. There is no mitral valve prolapse. The mitral valve leaflets  appeared open and close normally. The left atrium is normal sized.   A median sternotomy incision is performed and the left internal mammary  artery is dissected from the chest wall and prepared for bypass grafting.  The left internal mammary artery is somewhat small caliber but good-quality  conduit. It has good flow. Simultaneously, saphenous vein is obtained  initially from the patient's right thigh using endoscopic vein harvest  technique. Portions of this vein are satisfactory, but there are numerous  areas of the vein that are small-caliber and/or poor quality. Additional  segment of saphenous vein is then obtained from the patient's left thigh  using endoscopic vein harvest technique. The saphenous vein at and below the  level of the knee becomes small on both sides. Ultimately enough saphenous  vein is utilized to constructed the conduits as described, although for two  of the vein grafts, portions of saphenous vein have to be sewn together to  establish enough adequate length for grafting.   The patient is heparinized systemically. The pericardium is opened. The  ascending aorta is normal in appearance. The ascending aorta and right  atrium are cannulated for cardiopulmonary bypass. Adequate heparinization is  verified. Cardiopulmonary bypass is begun and the surface of the heart is  inspected. Distal sites are selected for coronary bypass grafting. A  temperature probe is placed the left ventricular septum. Cardioplegic  catheter is placed in the ascending aorta.   The patient is allowed to cool passively to 32 degrees systemic temperature. Aortic crossclamp is applied and  cardioplegia is delivered in antegrade  fashion through the aortic root. Iced saline flush is applied for topical  hypothermia. Initial cardioplegic arrest and myocardial cooling are felt to  be excellent. Repeat doses of cardioplegia are administered intermittently  throughout the crossclamp portion of the operation both through the aortic  root and down the subsequently placed vein grafts to maintain septal  temperature below 15 degrees centigrade.   The following distal coronary anastomoses are performed:  1.  The distal right coronary artery is grafted with a saphenous vein graft      in end-to-side fashion. This vessel measures 1.8 mm in diameter at the      site of distal bypass and is of good quality.  2.  The first diagonal branch off the left anterior descending coronary      artery is grafted with saphenous vein graft in end-to-side fashion. This      vessel measures 1.3 mm in diameter at the site of distal bypass. It is      fair-quality. It is diffusely diseased, both proximally and distally.  3.  The second circumflex marginal branch is grafted with saphenous vein  graft in end-to-side fashion. This vessel measures 1.5 mm in diameter      and is good quality at the site of distal bypass.  4.  The distal left anterior descending coronary artery is grafted with left      internal mammary artery in end-to-side fashion. This vessel measures 1.7      mm in diameter and is of good quality at the site of distal bypass. It      is diffusely diseased proximally and distally.   Two proximal saphenous vein anastomoses are performed directly to the  ascending aorta prior to removal of the aortic crossclamp. The proximal end  of the vein graft to the diagonal branch and the proximal end of the vein  graft to the right coronary artery are both grafted to the aorta. The  segment of vein utilized to graft the circumflex marginal branch is not long  enough to reach the aorta. The left  ventricular septal temperature rises  rapidly upon reperfusion of left internal mammary artery. The aortic  crossclamp is removed after total crossclamp time of 60 minutes.   The heart begins to beat spontaneously without need for cardioversion. The  proximal end of the vein graft to the circum circumflex marginal branch is  now sewn in an end-to-side fashion onto the side of the vein graft to the  diagonal branch in a T fashion. All proximal and distal coronary anastomoses  are inspected for hemostasis and appropriate graft orientation. Epicardial  pacing wires are fixed to the right ventricular outflow tract and to the  right atrial appendage. The patient is rewarmed to 37 degrees centigrade  temperature. The patient is weaned from cardiopulmonary bypass without  difficulty. The patient's rhythm at separation from bypass is junctional  rhythm. Atrial pacing is employed. No inotropic support is required. Total cardiopulmonary bypass time the operation is 81 minutes.   The venous and arterial cannulae are removed uneventfully. Protamine is  administered to reverse the anticoagulation. The mediastinum and left chest  are irrigated with saline solution containing vancomycin. Meticulous  surgical hemostasis is ascertained. The mediastinum and the left pleural  space are drained using three chest tubes exited through separate stab  incisions inferiorly. The median sternotomy is closed in routine fashion.  The soft tissues anterior to the sternal closed in multiple layers and the  skin is closed with a running subcuticular skin closure.   The patient tolerated the procedure well and is transported to the surgical  intensive care unit in stable condition. There are no intraoperative  complications. All sponge, instrument and needle counts are verified correct  at completion of the operation. The patient was transfused 2 units packed  red blood cells during cardiopulmonary bypass due to anemia  which was  present preoperatively and exacerbated by surgery.       CHO/MEDQ  D:  12/12/2004  T:  12/12/2004  Job:  [id number]   cc:   Learta Codding, M.D. [facility]   Oneal Deputy. Juanetta Gosling, M.D.  [address]  [place]  [place] [address]  Fax: [phone]

## 2010-12-06 ENCOUNTER — Encounter: Payer: Self-pay | Admitting: *Deleted

## 2010-12-09 ENCOUNTER — Encounter: Payer: Self-pay | Admitting: *Deleted

## 2010-12-09 ENCOUNTER — Encounter: Payer: Self-pay | Admitting: Cardiology

## 2010-12-09 ENCOUNTER — Ambulatory Visit (INDEPENDENT_AMBULATORY_CARE_PROVIDER_SITE_OTHER): Payer: Medicare Other | Admitting: Cardiology

## 2010-12-09 ENCOUNTER — Other Ambulatory Visit: Payer: Self-pay | Admitting: Cardiology

## 2010-12-09 VITALS — BP 106/61 | HR 65 | Ht 64.0 in | Wt 144.0 lb

## 2010-12-09 DIAGNOSIS — I10 Essential (primary) hypertension: Secondary | ICD-10-CM

## 2010-12-09 DIAGNOSIS — E785 Hyperlipidemia, unspecified: Secondary | ICD-10-CM

## 2010-12-09 DIAGNOSIS — I6350 Cerebral infarction due to unspecified occlusion or stenosis of unspecified cerebral artery: Secondary | ICD-10-CM

## 2010-12-09 DIAGNOSIS — I6529 Occlusion and stenosis of unspecified carotid artery: Secondary | ICD-10-CM

## 2010-12-09 DIAGNOSIS — I080 Rheumatic disorders of both mitral and aortic valves: Secondary | ICD-10-CM

## 2010-12-09 DIAGNOSIS — I25810 Atherosclerosis of coronary artery bypass graft(s) without angina pectoris: Secondary | ICD-10-CM

## 2010-12-09 DIAGNOSIS — R0602 Shortness of breath: Secondary | ICD-10-CM

## 2010-12-09 DIAGNOSIS — Z79899 Other long term (current) drug therapy: Secondary | ICD-10-CM

## 2010-12-09 DIAGNOSIS — E7800 Pure hypercholesterolemia, unspecified: Secondary | ICD-10-CM

## 2010-12-09 NOTE — Assessment & Plan Note (Signed)
Apparently the patient was followed by Dr. fields in the past. I don't think this is the case anymore now. I have ordered carotid Dopplers to followup on her left carotid bruit which was quite significant.

## 2010-12-09 NOTE — Assessment & Plan Note (Signed)
Blood pressure well controlled

## 2010-12-09 NOTE — Patient Instructions (Signed)
   Carotid dopplers Your physician recommends that you also have labs - can be done at same time as above test. Your physician wants you to follow up in: 6 months.  You will receive a reminder letter in the mail one-two months in advance.  If you don't receive a letter, please call our office to schedule the follow up appointment

## 2010-12-09 NOTE — Assessment & Plan Note (Signed)
No recurrent chest pain. No indication for stress testing at the present time

## 2010-12-09 NOTE — Progress Notes (Signed)
HPI The patient is a 77 year old female with a history of coronary artery disease, status post coronary bypass grafting. She has a history of normal LV function. She also has mild cerebrovascular disease. She also has mild mitral regurgitation hypertension dyslipidemia and some degree of dementia. The patient had a stress test in 2010 which was negative for ischemia. Carotid Dopplers were also done in 2010 revealing 60-79% stenosis of the left internal carotid artery and 40-59% of the right internal carotid artery. Apparently the patient is followed by Dr Darrick Penna in [place]. The patient is doing well from a cardiovascular perspective. However she has significant dementia. She denies any chest pain or shortness of breath palpitations or syncope. She now resides in a skilled nursing facility.  No Known Allergies  Current Outpatient Prescriptions on File Prior to Visit  Medication Sig Dispense Refill  . amLODipine (NORVASC) 5 MG tablet Take 5 mg by mouth daily.        Marland Kitchen aspirin 81 MG tablet Take 81 mg by mouth daily.        . calcium-vitamin D (OSCAL WITH D) 500-200 MG-UNIT per tablet Take 1 tablet by mouth daily.        . furosemide (LASIX) 20 MG tablet Take 20 mg by mouth daily.        Marland Kitchen lisinopril (PRINIVIL,ZESTRIL) 20 MG tablet Take 20 mg by mouth daily.        . nitroGLYCERIN (NITROSTAT) 0.4 MG SL tablet Place 0.4 mg under the tongue every 5 (five) minutes as needed.        . potassium chloride (KLOR-CON) 10 MEQ CR tablet Take 10 mEq by mouth daily.        . simvastatin (ZOCOR) 20 MG tablet Take 20 mg by mouth at bedtime.        Marland Kitchen DISCONTD: memantine (NAMENDA) 10 MG tablet Take 10 mg by mouth 2 (two) times daily.        Marland Kitchen DISCONTD: metoprolol tartrate (LOPRESSOR) 25 MG tablet Take 25 mg by mouth daily.          Past Medical History  Diagnosis Date  . Mitral valve insufficiency and aortic valve insufficiency   . Unspecified essential hypertension   . Other and unspecified hyperlipidemia     . Coronary atherosclerosis of native coronary artery   . Pure hypercholesterolemia   . Postsurgical aortocoronary bypass status   . Hypopotassemia   . Anemia, unspecified   . Other persistent mental disorders due to conditions classified elsewhere   . Mixed hyperlipidemia   . Encounter for long-term (current) use of other medications     Past Surgical History  Procedure Date  . Coronary artery bypass graft   . Abdominal hysterectomy     Family History  Problem Relation Age of Onset  . Coronary artery disease      FAMILY HISTORY    History   Social History  . Marital Status: Widowed    Spouse Name: N/A    Number of Children: N/A  . Years of Education: N/A   Occupational History  . RETIRED    Social History Main Topics  . Smoking status: Never Smoker   . Smokeless tobacco: Never Used  . Alcohol Use: No  . Drug Use: No  . Sexually Active: Not on file   Other Topics Concern  . Not on file   Social History Narrative  . No narrative on file   QMV:HQIONGEXB positives as outlined above. The remainder of the 68  point review of systems is negative   PHYSICAL EXAM BP 106/61  Pulse 65  Ht 5\' 4"  (1.626 m)  Wt 144 lb (65.318 kg)  BMI 24.72 kg/m2  General: Well-developed, well-nourished in no distress Head: Normocephalic and atraumatic Eyes:PERRLA/EOMI intact, conjunctiva and lids normal Ears: No deformity or lesions Mouth:normal dentition, normal posterior pharynx Neck: Supple, no JVD.  No masses, thyromegaly or abnormal cervical nodes. Left carotid bruit Lungs: Normal breath sounds bilaterally without wheezing.  Normal percussion Cardiac: regular rate and rhythm with normal S1 and S2, no S3 or S4.  PMI is normal.  No pathological murmurs Abdomen: Normal bowel sounds, abdomen is soft and nontender without masses, organomegaly or hernias noted.  No hepatosplenomegaly MSK: Back normal, normal gait muscle strength and tone normal Vascular: Pulse is normal in all 4  extremities Extremities: No peripheral pitting edema Neurologic: Alert and oriented x 3 Skin: Intact without lesions or rashes Lymphatics: No significant adenopathy Psychologic: Somewhat flat affect  ECG: Not available  ASSESSMENT AND PLAN

## 2010-12-09 NOTE — Assessment & Plan Note (Signed)
No evidence of heart failure symptoms. No significant murmur on exam.

## 2010-12-09 NOTE — Assessment & Plan Note (Signed)
We'll obtain a lipid panel in addition to some basic laboratory work

## 2010-12-25 ENCOUNTER — Telehealth: Payer: Self-pay | Admitting: *Deleted

## 2010-12-25 NOTE — Telephone Encounter (Signed)
Pt's result letter was returned due to "no such street, unable to forward."   Contacted pt, who is resident at [facility]. Dondra Spry notified of results and correct address obtained.

## 2011-01-08 ENCOUNTER — Encounter (HOSPITAL_COMMUNITY): Payer: Self-pay | Admitting: Radiology

## 2011-01-08 ENCOUNTER — Emergency Department (HOSPITAL_COMMUNITY): Payer: Medicare Other

## 2011-01-08 ENCOUNTER — Inpatient Hospital Stay (HOSPITAL_COMMUNITY)
Admission: EM | Admit: 2011-01-08 | Discharge: 2011-01-13 | DRG: 640 | Disposition: A | Discharge status: Nursing Home (Includes ICF) | Payer: Medicare Other | Attending: Pulmonary Disease | Admitting: Pulmonary Disease

## 2011-01-08 DIAGNOSIS — E785 Hyperlipidemia, unspecified: Secondary | ICD-10-CM | POA: Diagnosis present

## 2011-01-08 DIAGNOSIS — I4891 Unspecified atrial fibrillation: Secondary | ICD-10-CM | POA: Diagnosis present

## 2011-01-08 DIAGNOSIS — Z951 Presence of aortocoronary bypass graft: Secondary | ICD-10-CM

## 2011-01-08 DIAGNOSIS — E871 Hypo-osmolality and hyponatremia: Principal | ICD-10-CM | POA: Diagnosis present

## 2011-01-08 DIAGNOSIS — I059 Rheumatic mitral valve disease, unspecified: Secondary | ICD-10-CM | POA: Diagnosis present

## 2011-01-08 DIAGNOSIS — G929 Unspecified toxic encephalopathy: Secondary | ICD-10-CM | POA: Diagnosis present

## 2011-01-08 DIAGNOSIS — F0390 Unspecified dementia without behavioral disturbance: Secondary | ICD-10-CM | POA: Diagnosis present

## 2011-01-08 DIAGNOSIS — N189 Chronic kidney disease, unspecified: Secondary | ICD-10-CM | POA: Diagnosis present

## 2011-01-08 DIAGNOSIS — H5316 Psychophysical visual disturbances: Secondary | ICD-10-CM | POA: Diagnosis present

## 2011-01-08 DIAGNOSIS — I129 Hypertensive chronic kidney disease with stage 1 through stage 4 chronic kidney disease, or unspecified chronic kidney disease: Secondary | ICD-10-CM | POA: Diagnosis present

## 2011-01-08 DIAGNOSIS — I2510 Atherosclerotic heart disease of native coronary artery without angina pectoris: Secondary | ICD-10-CM | POA: Diagnosis present

## 2011-01-08 DIAGNOSIS — G92 Toxic encephalopathy: Secondary | ICD-10-CM | POA: Diagnosis present

## 2011-01-08 LAB — DIFFERENTIAL
Basophils Relative: 1 % (ref 0–1)
Eosinophils Absolute: 0.1 10*3/uL (ref 0.0–0.7)
Eosinophils Relative: 2 % (ref 0–5)
Lymphs Abs: 1 10*3/uL (ref 0.7–4.0)
Monocytes Relative: 17 % — ABNORMAL HIGH (ref 3–12)
Neutrophils Relative %: 51 % (ref 43–77)

## 2011-01-08 LAB — CBC
MCH: 31.7 pg (ref 26.0–34.0)
MCV: 91.5 fL (ref 78.0–100.0)
Platelets: 194 10*3/uL (ref 150–400)
RBC: 3.53 MIL/uL — ABNORMAL LOW (ref 3.87–5.11)
RDW: 13 % (ref 11.5–15.5)

## 2011-01-08 LAB — URINALYSIS, ROUTINE W REFLEX MICROSCOPIC
Nitrite: NEGATIVE
Specific Gravity, Urine: 1.01 (ref 1.005–1.030)
Urobilinogen, UA: 0.2 mg/dL (ref 0.0–1.0)
pH: 6.5 (ref 5.0–8.0)

## 2011-01-08 LAB — COMPREHENSIVE METABOLIC PANEL
CO2: 29 mEq/L (ref 19–32)
Calcium: 9.8 mg/dL (ref 8.4–10.5)
Creatinine, Ser: 1.37 mg/dL — ABNORMAL HIGH (ref 0.50–1.10)
GFR calc Af Amer: 45 mL/min — ABNORMAL LOW (ref >60)
GFR calc non Af Amer: 37 mL/min — ABNORMAL LOW (ref >60)
Glucose, Bld: 115 mg/dL — ABNORMAL HIGH (ref 70–99)
Total Protein: 7.1 g/dL (ref 6.0–8.3)

## 2011-01-08 LAB — URINE MICROSCOPIC-ADD ON

## 2011-01-09 LAB — BASIC METABOLIC PANEL
BUN: 17 mg/dL (ref 6–23)
Calcium: 9.4 mg/dL (ref 8.4–10.5)
Creatinine, Ser: 1.23 mg/dL — ABNORMAL HIGH (ref 0.50–1.10)
GFR calc non Af Amer: 42 mL/min — ABNORMAL LOW (ref >60)
Glucose, Bld: 83 mg/dL (ref 70–99)

## 2011-01-09 LAB — URINE CULTURE
Colony Count: NO GROWTH
Culture: NO GROWTH

## 2011-01-10 LAB — BASIC METABOLIC PANEL
Calcium: 9.6 mg/dL (ref 8.4–10.5)
GFR calc non Af Amer: 45 mL/min — ABNORMAL LOW (ref >60)
Glucose, Bld: 114 mg/dL — ABNORMAL HIGH (ref 70–99)
Sodium: 136 mEq/L (ref 135–145)

## 2011-01-10 LAB — URINALYSIS, ROUTINE W REFLEX MICROSCOPIC
Hgb urine dipstick: NEGATIVE
Nitrite: NEGATIVE
Protein, ur: NEGATIVE mg/dL
Urobilinogen, UA: 0.2 mg/dL (ref 0.0–1.0)

## 2011-01-10 MED ORDER — SODIUM CHLORIDE 0.9 % IV SOLN: INTRAVENOUS | Status: DC

## 2011-01-10 MED ORDER — POTASSIUM CHLORIDE CRYS ER 10 MEQ PO TBCR
10.0000 meq | EXTENDED_RELEASE_TABLET | Freq: Every day | ORAL | Status: DC
  Administered 2011-01-11 – 2011-01-13 (×3): 10 meq via ORAL
  Filled 2011-01-10 (×3): qty 1

## 2011-01-10 MED ORDER — SIMVASTATIN 20 MG PO TABS
20.0000 mg | ORAL_TABLET | Freq: Every day | ORAL | Status: DC
  Administered 2011-01-11 – 2011-01-12 (×2): 20 mg via ORAL
  Filled 2011-01-10 (×2): qty 1

## 2011-01-10 MED ORDER — BIOTENE DRY MOUTH MT LIQD
Freq: Two times a day (BID) | OROMUCOSAL | Status: DC
  Administered 2011-01-11 – 2011-01-13 (×5): via OROMUCOSAL
  Filled 2011-01-10 (×9): qty 10

## 2011-01-10 MED ORDER — LISINOPRIL 10 MG PO TABS
20.0000 mg | ORAL_TABLET | Freq: Every day | ORAL | Status: DC
  Administered 2011-01-11 – 2011-01-13 (×3): 20 mg via ORAL
  Filled 2011-01-10 (×3): qty 2

## 2011-01-10 MED ORDER — MEMANTINE HCL 10 MG PO TABS
10.0000 mg | ORAL_TABLET | Freq: Two times a day (BID) | ORAL | Status: DC
  Administered 2011-01-11 – 2011-01-13 (×5): 10 mg via ORAL
  Filled 2011-01-10 (×5): qty 1

## 2011-01-10 MED ORDER — METOPROLOL SUCCINATE ER 25 MG PO TB24
25.0000 mg | ORAL_TABLET | Freq: Every day | ORAL | Status: DC
  Administered 2011-01-11 – 2011-01-13 (×3): 25 mg via ORAL
  Filled 2011-01-10 (×3): qty 1

## 2011-01-10 MED ORDER — CALCIUM CARBONATE-VITAMIN D 500-200 MG-UNIT PO TABS
1.0000 | ORAL_TABLET | Freq: Every day | ORAL | Status: DC
  Administered 2011-01-11 – 2011-01-13 (×3): 1 via ORAL
  Filled 2011-01-10 (×3): qty 1

## 2011-01-10 MED ORDER — AMLODIPINE BESYLATE 5 MG PO TABS
5.0000 mg | ORAL_TABLET | Freq: Every day | ORAL | Status: DC
  Administered 2011-01-11 – 2011-01-13 (×3): 5 mg via ORAL
  Filled 2011-01-10 (×3): qty 1

## 2011-01-10 MED ORDER — ALPRAZOLAM 0.5 MG PO TABS
0.5000 mg | ORAL_TABLET | Freq: Three times a day (TID) | ORAL | Status: DC | PRN
  Administered 2011-01-11 – 2011-01-12 (×2): 0.5 mg via ORAL
  Filled 2011-01-10 (×2): qty 1

## 2011-01-10 MED ORDER — ASPIRIN 81 MG PO CHEW
81.0000 mg | CHEWABLE_TABLET | Freq: Every day | ORAL | Status: DC
  Administered 2011-01-11 – 2011-01-13 (×3): 81 mg via ORAL
  Filled 2011-01-10 (×3): qty 1

## 2011-01-10 MED ORDER — NITROGLYCERIN 0.4 MG SL SUBL: 0.4000 mg | SUBLINGUAL_TABLET | SUBLINGUAL | Status: DC | PRN

## 2011-01-10 MED ORDER — ENOXAPARIN SODIUM 40 MG/0.4ML ~~LOC~~ SOLN
40.0000 mg | SUBCUTANEOUS | Status: DC
  Administered 2011-01-11 – 2011-01-12 (×2): 40 mg via SUBCUTANEOUS
  Filled 2011-01-10 (×2): qty 0.4

## 2011-01-11 NOTE — Plan of Care (Signed)
Problem: Phase I Progression Outcomes Goal: OOB as tolerated unless otherwise ordered Outcome: Progressing Patient is OOB with assist,due to confusion  Problem: Phase II Progression Outcomes Goal: Progress activity as tolerated unless otherwise ordered Outcome: Progressing Patient has a steady gait, is OOB with assist due to confusion.  Problem: Phase III Progression Outcomes Goal: Activity at appropriate level-compared to baseline (UP IN CHAIR FOR HEMODIALYSIS)  Outcome: Progressing Steady gait, OOB with assist, due to confusion.

## 2011-01-11 NOTE — H&P (Signed)
NAMEVAIL, BASISTA NO.:  [id number]  MEDICAL RECORD NO.:  [id number]  LOCATION:  A334                          FACILITY:  [facility]  PHYSICIAN:  Purcell Nails, MD DATE OF BIRTH:  1925/12/27  DATE OF ADMISSION:  01/08/2011 DATE OF DISCHARGE:  LH                             HISTORY & PHYSICAL   CHIEF COMPLAINT:  Altered mental status.  HISTORY OF PRESENT ILLNESS:  This is an 77 year old female with medical history significant for severe three-vessel coronary artery disease status post coronary artery bypass graft in the remote past, mild mitral regurgitation, hypertension, hyperlipidemia, history of atrial fibrillation, right forearm thrombophlebitis, currently residing in an assisted living facility, brought in because of altered mental status associated with some visual hallucination.  The patient, however, denies any fever, shortness of breath, or chest pain.  She was found to have hyponatremia at 128 and increasing creatinine at 1.37.  Her head CT and chest x-ray where negative as well as urinalysis.  The patient was subsequently considered for admission for observation.  Her cardiac enzymes were also negative.  She is a poor historian.  I have evaluated her on the floor while she has been stabilized.  No acute complaints at this point.  The patient states she is getting an assistance with her medications and activities of daily living at the [facility] home.  PAST MEDICAL HISTORY:  As above.  PAST SURGICAL HISTORY:  Coronary artery bypass graft, catheterization, endoscopic vein harvest from bilateral thighs.  HOME MEDICATIONS: 1. Simvastatin 20 mg daily. 2. Metoprolol 25 mg daily. 3. Os-Cal 500/200 once daily. 4. Namenda 10 mg twice a day. 5. Amlodipine 5 mg twice a day. 6. Potassium chloride 10 mEq once a day. 7. Aspirin 81 mg once a day. 8. Lisinopril 20 mg once a day. 9. Fosamax 20 mg once a day. 10.Nitrostat 0.4 mg p.r.n.  REVIEW OF SYSTEMS:  As  in HPI.  All others are reviewed and negative. She denies any bleeding, headache, shortness of breath, nausea, or vomiting.  She, however, complains of some bruises on bilateral upper extremities.  She is allergic to PENICILLINS which cause hives.  FAMILY HISTORY:  Noncontributory.  SOCIAL HISTORY:  Negative for smoking, alcohol, or drug use.  PHYSICAL EXAMINATION:  GENERAL:  She is alert and oriented x3. VITAL SIGNS:  Blood pressure 113/64, pulse rate 71, and temperature 98.3. HEENT:  Moist mucous membranes. NECK:  Negative for JVD or thyromegaly. CHEST:  Clear to auscultation bilaterally. CARDIOVASCULAR:  Normal S1 and S2.  No murmur.  No gallop. ABDOMEN:  Soft and nontender. EXTREMITIES:  No edema. CNS:  Nonfocal. SKIN:  Some bruises and ecchymosis on bilateral upper extremities.  LABORATORY DATA:  WBC 3.5, hemoglobin 11.2, hematocrit 32.3, and platelet count 194.  Her chemistry shows sodium 128 low, potassium 4.2, chloride 90, bicarb 29, glucose 115, BUN 19, and creatinine 1.37.  AST and ALT are normal, total protein 7.1, and calcium 9.8.  Her chest x-ray showed some COPD with no acute lung disease, partial compression deformity of the lower thoracic or upper lumbar vertebral bodies.  Head CT was significant for mildly progressive atrophy, stable chronic small vessel ischemic changes.  No acute  intracranial findings.  ASSESSMENT AND PLAN: 1. Altered mental status, etiology unclear, hyponatremia versus     metabolic encephalopathy.  Hyponatremia duration unknown, likely     chronic, would cautiously hydrate with normal saline at 50-70     mL/hour, not to exceed total fluid intake of 1000 mL per 24 hours.     I will request Neurology consultation for evaluation for altered     mental status.  The patient will need assistance for ADL as well as     feeding assistance one-to-one. 2. Dementia.  Continue supportive care. 3. Hypertension.  We will continue her home medications  except for     furosemide. 4. Hyperlipidemia.  Continue simvastatin 20 mg at bedtime. 5. Deep vein thrombosis prophylaxis with 40 mg of Lovenox subcu daily.  The patient's code status is full code.  DISPOSITION:  To be determined.  We will include TSH and free T4 along her a.m. labs in the morning.                                           ______________________________ Purcell Nails, MD     GN/MEDQ  D:  01/08/2011  T:  01/09/2011  Job:  [id number]  Electronically Signed by Purcell Nails MD on 01/11/2011 12:09:08 AM

## 2011-01-12 DIAGNOSIS — F0390 Unspecified dementia without behavioral disturbance: Secondary | ICD-10-CM | POA: Diagnosis present

## 2011-01-12 MED ORDER — FUROSEMIDE 40 MG PO TABS: 40.0000 mg | ORAL_TABLET | Freq: Two times a day (BID) | ORAL | Status: DC

## 2011-01-12 MED ORDER — FUROSEMIDE 10 MG/ML IJ SOLN: 40.0000 mg | Freq: Once | INTRAMUSCULAR | Status: DC

## 2011-01-12 NOTE — Group Therapy Note (Signed)
NAMECYANI, KALLSTROM NO.:  [id number]  MEDICAL RECORD NO.:  [id number]  LOCATION:  A334                          FACILITY:  [facility]  PHYSICIAN:  Mila Homer. Sudie Bailey, M.D.DATE OF BIRTH:  April 14, 1926  DATE OF PROCEDURE: DATE OF DISCHARGE:                                PROGRESS NOTE   SUBJECTIVE:  This patient feels much better.  Her daughters and son are in the room with her today.  She was admitted here for altered mental status and possible urinary tract infection.  OBJECTIVE:  VITAL SIGNS:  Today, her temperature is 98 degrees, pulse 79, rest rate 16, blood pressure 116/67.  O2 sat 97%. GENERAL:  She is no acute distress.  She is sitting up in bed.  She is well-developed, well-nourished.  She appears to be alert and somewhat oriented, although she realizes she has a terrible memory. LUNGS:  Clear throughout. HEART:  Fairly regular rhythm. ABDOMEN:  There is no CVA or flank pain.  No suprapubic tenderness. EXTREMITIES:  There is no edema of the ankles today.  ASSESSMENT: 1. Delirium, question etiology. 2. Chronic atrial fibrillation. 3. Dementia. 4. Benign essential hypertension. 5. Coronary artery disease status post bypass graft. 6. Hypercholesterolemia. 7. Carotid artery disease.  PLAN:  I talked to the family.  I understood she had been on Cipro b.i.d. at the rest home for a urinary tract infection.  I looked at the urine C&S which was negative when she came up here.  I reviewed the notes from Dr. Juanetta Gosling, and Dr. Gerilyn Pilgrim, the neurologist.     Mila Homer. Sudie Bailey, M.D.     SDK/MEDQ  D:  01/12/2011  T:  01/12/2011  Job:  [id number]

## 2011-01-12 NOTE — Progress Notes (Signed)
[id number] 

## 2011-01-13 ENCOUNTER — Encounter (HOSPITAL_COMMUNITY): Payer: Self-pay

## 2011-01-13 ENCOUNTER — Emergency Department (HOSPITAL_COMMUNITY): Payer: Medicare Other

## 2011-01-13 ENCOUNTER — Emergency Department (HOSPITAL_COMMUNITY)
Admission: EM | Admit: 2011-01-13 | Discharge: 2011-01-14 | Disposition: A | Discharge status: Other Acute Inpatient Hospital | Payer: Medicare Other | Attending: Emergency Medicine | Admitting: Emergency Medicine

## 2011-01-13 ENCOUNTER — Other Ambulatory Visit: Payer: Self-pay

## 2011-01-13 DIAGNOSIS — I10 Essential (primary) hypertension: Secondary | ICD-10-CM | POA: Insufficient documentation

## 2011-01-13 DIAGNOSIS — Z951 Presence of aortocoronary bypass graft: Secondary | ICD-10-CM | POA: Insufficient documentation

## 2011-01-13 DIAGNOSIS — R4182 Altered mental status, unspecified: Secondary | ICD-10-CM | POA: Insufficient documentation

## 2011-01-13 DIAGNOSIS — I2510 Atherosclerotic heart disease of native coronary artery without angina pectoris: Secondary | ICD-10-CM | POA: Insufficient documentation

## 2011-01-13 DIAGNOSIS — Z862 Personal history of diseases of the blood and blood-forming organs and certain disorders involving the immune mechanism: Secondary | ICD-10-CM | POA: Insufficient documentation

## 2011-01-13 DIAGNOSIS — F0390 Unspecified dementia without behavioral disturbance: Secondary | ICD-10-CM | POA: Insufficient documentation

## 2011-01-13 LAB — CBC
Hemoglobin: 10.7 g/dL — ABNORMAL LOW (ref 12.0–15.0)
MCH: 31.1 pg (ref 26.0–34.0)
MCHC: 33.8 g/dL (ref 30.0–36.0)
MCV: 92.2 fL (ref 78.0–100.0)
Platelets: 175 10*3/uL (ref 150–400)
RBC: 3.44 MIL/uL — ABNORMAL LOW (ref 3.87–5.11)

## 2011-01-13 LAB — BASIC METABOLIC PANEL
BUN: 21 mg/dL (ref 6–23)
Calcium: 9.9 mg/dL (ref 8.4–10.5)
GFR calc Af Amer: 43 mL/min — ABNORMAL LOW (ref >60)
GFR calc non Af Amer: 35 mL/min — ABNORMAL LOW (ref >60)
Glucose, Bld: 97 mg/dL (ref 70–99)

## 2011-01-13 LAB — RAPID URINE DRUG SCREEN, HOSP PERFORMED
Amphetamines: NOT DETECTED
Cocaine: NOT DETECTED
Opiates: NOT DETECTED
Tetrahydrocannabinol: NOT DETECTED

## 2011-01-13 LAB — DIFFERENTIAL
Basophils Relative: 1 % (ref 0–1)
Eosinophils Absolute: 0.2 10*3/uL (ref 0.0–0.7)
Eosinophils Relative: 4 % (ref 0–5)
Lymphs Abs: 1.2 10*3/uL (ref 0.7–4.0)
Monocytes Relative: 14 % — ABNORMAL HIGH (ref 3–12)
Neutrophils Relative %: 53 % (ref 43–77)

## 2011-01-13 NOTE — Progress Notes (Signed)
Subjective: No new complaints are reported.  Objective: Vital signs in last 24 hours: Temp:  [98 F (36.7 C)-98.2 F (36.8 C)] 98 F (36.7 C) (07/09 0535) Pulse Rate:  [75-79] 77  (07/09 0535) Resp:  [16-20] 18  (07/09 0535) BP: (114-119)/(55-76) 114/55 mmHg (07/09 0535) SpO2:  [97 %-99 %] 98 % (07/09 0535) Weight:  [68.6 kg (151 lb 3.8 oz)-68.9 kg (151 lb 14.4 oz)] 151 lb 3.8 oz (68.6 kg) (07/09 0443)  Intake/Output from previous day: 07/08 0701 - 07/09 0700 In: 1200 [P.O.:1200] Out: 350 [Urine:350] Intake/Output this shift:   Nutritional status: General   PE The patient is awake and alert. She still is somewhat disoriented. She thinks is 2005. She knows she is in the hospital. Speech is normal.  Pupils equal round and reactive to light. Visual fields are intact. Facial muscle strength is symmetric. Extraocular movements are full. She has antigravity strength throughout no pronator drift. Coordination is normal.  Studies/Results: No results found.  Medications:  Current facility-administered medications:ALPRAZolam Prudy Feeler) tablet 0.5 mg, 0.5 mg, Oral, Q8H PRN, Gokul Krishnan, 0.5 mg at 01/12/11 2023;  amLODipine (NORVASC) tablet 5 mg, 5 mg, Oral, Daily, Gokul Krishnan, 5 mg at 01/12/11 1025;  aspirin chewable tablet 81 mg, 81 mg, Oral, Daily, Gokul Krishnan, 81 mg at 01/12/11 1025;  BIOTENE DRY MOUTH (BIOTENE) solution, , Mouth Rinse, BID, Gokul Krishnan calcium-vitamin D (OSCAL WITH D) 500-200 MG-UNIT per tablet 1 tablet, 1 tablet, Oral, Daily, Gokul Krishnan, 1 tablet at 01/12/11 1025;  enoxaparin (LOVENOX) injection 40 mg, 40 mg, Subcutaneous, Q24H, Gokul Krishnan, 40 mg at 01/12/11 2122;  lisinopril (PRINIVIL,ZESTRIL) tablet 20 mg, 20 mg, Oral, Daily, Gokul Krishnan, 20 mg at 01/12/11 1026;  memantine (NAMENDA) tablet 10 mg, 10 mg, Oral, BID, Gokul Krishnan, 10 mg at 01/12/11 2122 metoprolol succinate (TOPROL-XL) 24 hr tablet 25 mg, 25 mg, Oral, Daily, Gokul Krishnan, 25 mg at  01/12/11 1026;  nitroGLYCERIN (NITROSTAT) SL tablet 0.4 mg, 0.4 mg, Sublingual, Q5 Min x 3 PRN, Gokul Krishnan;  potassium chloride (K-DUR,KLOR-CON) CR tablet 10 mEq, 10 mEq, Oral, Daily, Gokul Krishnan, 10 mEq at 01/12/11 1026;  simvastatin (ZOCOR) tablet 20 mg, 20 mg, Oral, QHS, Gokul Krishnan, 20 mg at 01/12/11 2122 DISCONTD: 0.9 %  sodium chloride infusion, , Intravenous, Continuous, Milana Obey;  DISCONTD: furosemide (LASIX) injection 40 mg, 40 mg, Intravenous, Once, Milana Obey;  DISCONTD: furosemide (LASIX) tablet 40 mg, 40 mg, Oral, BID, Milana Obey   Assessment/Plan: Altered mentation thought to due to talk to metabolic derangement the dictator tract infection. Baseline advanced dementia. She is rather stable. We'll sign off reconsult as needed.   LOS: 5 days   Towana Stenglein

## 2011-01-13 NOTE — Group Therapy Note (Signed)
  [name], Fowler NO.:  [id number]  MEDICAL RECORD NO.:  [id number]  LOCATION:  A334                          FACILITY:  [facility]  PHYSICIAN:  Lyndia Bury A. Gerilyn Pilgrim, M.D. DATE OF BIRTH:  [birth year]  DATE OF PROCEDURE: DATE OF DISCHARGE:                                PROGRESS NOTE   No new complaints are reported.  Afebrile.  Vital signs stable.  She is drowsy, but easily arousable.  She is actually more oriented than she was yesterday.  She knows she is in the hospital.  She thinks it is 2010.  Speech is normal.  Does follow commands well.  Facial muscle strength is symmetric.  Extraocular movements are full.  She has good strength throughout.  Coordination is normal.  Urinalysis repeat is negative.  ASSESSMENT AND PLAN:  Acute worsening of baseline dementia, I suspect this is again is a metabolic issue.  She has improved.  Dr. Juanetta Gosling has sent the patient up for return to the nursing facility which is fine with me.  Continue with current care.     Francie Keeling A. Gerilyn Pilgrim, M.D.     KAD/MEDQ  D:  01/10/2011  T:  01/10/2011  Job:  [id number]  Electronically Signed by Beryle Beams M.D. on 01/13/2011 01:56:47 PM

## 2011-01-13 NOTE — Group Therapy Note (Signed)
[name], [name] NO.:  [id number]  MEDICAL RECORD NO.:  [id number]  LOCATION:  A334                          FACILITY:  [facility]  PHYSICIAN:  Tolulope Pinkett L. Juanetta Gosling, M.D.DATE OF BIRTH:  1925-12-27  DATE OF PROCEDURE:  01/11/2011 DATE OF DISCHARGE:                                PROGRESS NOTE   Karen Fowler was admitted with change in mental status.  Karen Fowler has done well.  Arrangements have been made for her to be discharged home to her assisted-living facility but there was some problem with that discharge occuring, so she is going to be discharged today instead of yesterday.  So I am not going to change anything and will plan to have her follow at the skilled care facility.  Please see her previously dictated discharge summary for details.     Jesslynn Kruck L. Juanetta Gosling, M.D.     ELH/MEDQ  D:  01/12/2011  T:  01/13/2011  Job:  [id number]

## 2011-01-13 NOTE — Consult Note (Signed)
NAMEMARITTA, Karen Fowler NO.:  [id number]  MEDICAL RECORD NO.:  [id number]  LOCATION:  A334                          FACILITY:  [facility]  PHYSICIAN:  Jiovani Mccammon A. Gerilyn Pilgrim, M.D. DATE OF BIRTH:  [birth year]  DATE OF CONSULTATION: DATE OF DISCHARGE:                                CONSULTATION   REASON FOR CONSULTATION:  Altered dementia.  This is an 77 year old white female who seemed to have a baseline history of dementia at baseline.  She is on Namenda.  She resides at a nursing home facility/assisted living facility.  She became more confused associated with visual hallucination and hence she was admitted for further evaluation.  She was noted to have hyponatremia, slightly increased creatinine.  Imaging has been unremarkable for anything acute. The patient has poor insight and why she is here and cannot provide adequate history.  PAST MEDICAL HISTORY:  Significant for atrial fibrillation.  She is not on chronic warfarin therapy, however unclear why.  There is a history of hypertension, vitamin B12 deficiency, dementia, hyperlipidemia, coronary artery disease, mild mitral valve regurgitation, right forearm thrombophlebitis.  ADMISSION MEDICATIONS: 1. Simvastatin. 2. Metoprolol. 3. Calcium. 4. Namenda 10 mg b.i.d. 5. Amlodipine 5 mg b.i.d. 6. Potassium. 7. Aspirin. 8. Lisinopril. 9. Fosamax. 10.Nitrostat.  REVIEW OF SYSTEMS:  Limited due to the cognitive impairment.  She does not report any focal weakness, however, no headache, spells, GI symptoms, shortness of breath.  She also complained of having bruising of the upper extremities, not clear why.  ALLERGIES:  PENICILLIN.  FAMILY HISTORY:  Negative.  SOCIAL HISTORY:  No tobacco, alcohol, or illicit drug use.  Again, she stays at assisted-living facility.  PHYSICAL EXAMINATION:  GENERAL:  Overweight lady in no acute distress. VITAL SIGNS:  Temperature 98.2, pulse 83, respirations 18, blood pressure  123/71. NECK:  Supple. HEENT:  Head is normocephalic, atraumatic. ABDOMEN:  Soft. EXTREMITIES:  There is ecchymotic spots involving the upper extremities. No edema is noted. MENTATION:  She is awake. She knows she is in the hospital, although she thinks she is somewhere in [place].  She also stated that she lives at her home in [place].  She is not oriented to time, she thinks it is [place] or 2006.  Speaking clear sentences.  No dysarthria.  Does follow commands well. CRANIAL NERVE EVALUATION:  Pupils are 4 mm and reactive.  Visual fields are intact.  Extraocular movements are full.  Facial muscle strength is symmetric.  Tongue midline.  Uvula midline.  Shoulder shrugs normal. MOTOR:  Normal tone, bulk and strength both the upper and lower extremities.  There is no pronator drift.  Reflexes are preserved. COORDINATION:  Normal finger-to-nose.  There is no dysmetria or tremors. No parkinsonism.  Sensation normal to light touch.  Gait is normal.  LABORATORY EVALUATION:  Vitamin B12 level is 627, RPR nonreactive, TSH 1.9.  WBC 8, hemoglobin 12, platelet count of 213.  Sodium 137, potassium 2.7, chloride 96, CO2 31, BUN 8, creatinine 0.7, glucose 100, calcium 9.0.  Urinalysis shows many epithelial cells.  Therefore not to be a great specimen, also many bacteria.  Wbc is 7-10, rbc is 0-2.  I believe the culture is pending.  Head CT scan shows no acute intracranial abnormalities.  There is chronic ischemic changes.  There is a right orbital soft tissue subcutaneous lesion apparently increased from previous scan.  MRI of the brain shows extensive deep white matter periventricular leukoencephalopathy, nothing acute is seen on diffusion imaging.  Carotid duplex Doppler shows somewhat increased velocity in the right, the systolic velocity is 128 involving the right ICA, left is 90.  ASSESSMENT AND PLAN: 1. Acute altered mentation.  I suspect this is likely a toxic     metabolic process.  The  imaging tests have been negative for     anything acute.  Potential etiology includes hyponatremia,     dehydration and urinary tract infection. 2. Baseline dementia. 3. Atrial fibrillation, apparently not on chronic warfarin therapy,     unclear why, possibly could be a fall risk but I do not see that     documented.  RECOMMENDATIONS:  I will consider repeating urinalysis and followup the urine culture.  Continue with current medications and continue to follow the patient.  Thanks for this consultation.     Jalesha Plotz A. Gerilyn Pilgrim, M.D.     KAD/MEDQ  D:  01/09/2011  T:  01/09/2011  Job:  [id number]  Electronically Signed by Beryle Beams M.D. on 01/13/2011 01:56:44 PM

## 2011-01-13 NOTE — ED Notes (Signed)
Laying quietly on Doctor, general practice. Hungry. Meal being obtained

## 2011-01-13 NOTE — ED Provider Notes (Signed)
History     Chief Complaint  Patient presents with  . Suicidal  . Homicidal  . Aggressive Behavior  . Medical Clearance   Patient is a 77 y.o. female presenting with altered mental status. The history is provided by the patient and the nursing home. No language interpreter was used.  Altered Mental Status This is a new problem. Episode onset: today. Episode frequency: intermittently. The problem has not changed since onset.Pertinent negatives include no chest pain, no abdominal pain and no shortness of breath. Associated symptoms comments: SI/HI. The symptoms are aggravated by nothing. The symptoms are relieved by nothing. She has tried nothing for the symptoms.  Patient a resident of [place]. Patient recently admitted to hospital for 1 week and d/c today to return to [facility]. Upon returning to [facility], patient became combative with SI/HI today stating that she wanted to get a shotgun and shoot everyone and herself. Representative from [facility] accompanying patient in ED states she has also been having visual hallucinations and is not appropriate to return to facility at this time. During exam, patient makes no mention of SI/HI but states multiple times that "Murlean Hark wants to marry me". Also reports patient recently finished abx for an infection last week.  Past Medical History  Diagnosis Date  . Mitral valve insufficiency and aortic valve insufficiency   . Unspecified essential hypertension   . Other and unspecified hyperlipidemia   . Coronary atherosclerosis of native coronary artery   . Pure hypercholesterolemia   . Postsurgical aortocoronary bypass status   . Hypopotassemia   . Anemia, unspecified   . Other persistent mental disorders due to conditions classified elsewhere   . Mixed hyperlipidemia   . Encounter for long-term (current) use of other medications     Past Surgical History  Procedure Date  . Coronary artery bypass graft   . Abdominal hysterectomy      Family History  Problem Relation Age of Onset  . Coronary artery disease      FAMILY HISTORY    History  Substance Use Topics  . Smoking status: Never Smoker   . Smokeless tobacco: Never Used  . Alcohol Use: No    OB History    Grav Para Term Preterm Abortions TAB SAB Ect Mult Living                  Review of Systems  Constitutional: Negative for fever.  HENT: Negative for rhinorrhea.   Eyes: Negative for pain.  Respiratory: Negative for cough and shortness of breath.   Cardiovascular: Negative for chest pain.  Gastrointestinal: Negative for abdominal pain.  Genitourinary: Negative for dysuria.  Musculoskeletal: Negative for back pain.  Skin: Negative for rash.  Neurological: Negative for speech difficulty.  Psychiatric/Behavioral: Positive for suicidal ideas, hallucinations, behavioral problems, confusion, agitation and altered mental status.       +Homicidal Ideations    Physical Exam  BP 120/76  Pulse 77  Temp(Src) 98.4 F (36.9 C) (Oral)  Resp 20  Ht 5\' 4"  (1.626 m)  Wt 147 lb (66.679 kg)  BMI 25.23 kg/m2  SpO2 98%  Physical Exam  Nursing note and vitals reviewed. Constitutional: Vital signs are normal. She appears well-developed and well-nourished.  HENT:  Head: Normocephalic and atraumatic.  Eyes: Conjunctivae are normal. Pupils are equal, round, and reactive to light. No scleral icterus.  Neck: Neck supple.  Cardiovascular: Normal rate, regular rhythm, normal heart sounds, intact distal pulses and normal pulses.  Exam reveals  no gallop and no friction rub.   No murmur heard. Pulmonary/Chest: Effort normal and breath sounds normal. She has no wheezes.  Abdominal: Soft. Bowel sounds are normal. She exhibits no distension. There is no tenderness. There is no rebound and no guarding.  Musculoskeletal: She exhibits no edema.  Neurological: She is alert.  Skin: Skin is warm and dry.  Psychiatric: Her speech is normal. She is not actively  hallucinating. Cognition and memory are impaired. She expresses no homicidal and no suicidal ideation.       Cooperative and appropriate but confused. No active psychosis or hallucinations but intermittent symptoms.     ED Course  Procedures   Date: 01/13/2011  Rate: 69  Rhythm: normal sinus rhythm  QRS Axis: normal  Intervals: normal  ST/T Wave abnormalities: nonspecific ST changes  Conduction Disutrbances:none  Narrative Interpretation:   Old EKG Reviewed: unchanged    ACT team will assess in ED for placement  MDM Elderly patient recently admitted now hallucinations and inappropriate behavior, such that she is not able to return to her group home. ACT team involved, will seek placement at [facility].       Sunnie Nielsen, MD 01/13/11 [phone]

## 2011-01-13 NOTE — Group Therapy Note (Signed)
[name], Karen Fowler NO.:  [id number]  MEDICAL RECORD NO.:  [id number]  LOCATION:  A334                          FACILITY:  [facility]  PHYSICIAN:  Hesston Hitchens L. Juanetta Gosling, M.D.DATE OF BIRTH:  08-24-1925  DATE OF PROCEDURE:  01/13/2011 DATE OF DISCHARGE:  01/13/2011                                PROGRESS NOTE   [name] was admitted with change in mental status, perhaps related to hyponatremia.  She has improved and was actually ready for discharge and discharge was ordered on June 6 and again on June 7 I am not sure what is happened that she has not been discharged.  At any rate she is being set for discharge today.  Please see my previously dictated discharge summary for details.  There has been no significant changes.     Dai Apel L. Juanetta Gosling, M.D.     ELH/MEDQ  D:  01/13/2011  T:  01/13/2011  Job:  [id number]

## 2011-01-13 NOTE — ED Notes (Signed)
Pt brought in from [facility]. Pt d/c from floor today and became combative/SI/and HI today when returned to [facility]. Pt now in triage and while talking with coordinator from [facility] pt asking "did I do all that". Pt now hallucinating and sates "Jearld Pies wants to marry me"

## 2011-01-13 NOTE — Plan of Care (Signed)
Problem: Consults Goal: UTI/Pyelonephritis Patient Education See Patient Education Module for education specifics.  Outcome: Completed/Met Date Met:  01/13/11 Patient is confused, not sure how much she understands.

## 2011-01-14 ENCOUNTER — Encounter (HOSPITAL_COMMUNITY): Payer: Self-pay

## 2011-01-14 LAB — URINALYSIS, ROUTINE W REFLEX MICROSCOPIC
Glucose, UA: NEGATIVE mg/dL
Hgb urine dipstick: NEGATIVE
Leukocytes, UA: NEGATIVE
Specific Gravity, Urine: 1.005 (ref 1.005–1.030)
Urobilinogen, UA: 0.2 mg/dL (ref 0.0–1.0)

## 2011-01-14 MED ORDER — LORAZEPAM 1 MG PO TABS: 1.0000 mg | ORAL_TABLET | Freq: Three times a day (TID) | ORAL | Status: DC | PRN

## 2011-01-14 MED ORDER — ONDANSETRON HCL 4 MG PO TABS: 4.0000 mg | ORAL_TABLET | Freq: Three times a day (TID) | ORAL | Status: DC | PRN

## 2011-01-14 MED ORDER — ACETAMINOPHEN 325 MG PO TABS: 650.0000 mg | ORAL_TABLET | ORAL | Status: DC | PRN

## 2011-01-14 NOTE — ED Notes (Signed)
Pt has been accepted @ [facility]. Report has been called to [facility]. Pt to be transported by [facility].

## 2011-01-14 NOTE — Progress Notes (Signed)
0700 Patient has been up to the bathroom several times during the night. She has been cooperative. No behavioral issues. 0715 Patient has been accepted at [facility] by Dr. Retia Passe.

## 2011-01-23 NOTE — Discharge Summary (Signed)
NAMEMANROOP, JAKUBOWICZ NO.:  [id number]  MEDICAL RECORD NO.:  [id number]  LOCATION:  A334                          FACILITY:  [facility]  PHYSICIAN:  Aysiah Jurado L. Juanetta Gosling, M.D.DATE OF BIRTH:  May 31, 1926  DATE OF ADMISSION:  01/08/2011 DATE OF DISCHARGE:  LH                              DISCHARGE SUMMARY   FINAL DISCHARGE DIAGNOSES: 1. Altered mental status, probably multifactorial. 2. Hyponatremia, resolved. 3. Chronic kidney disease. 4. Coronary artery occlusive disease, status post bypass grafting. 5. Mitral regurgitation. 6. Hypertension. 7. Hyperlipidemia. 8. Chronic atrial fibrillation not felt to be a Coumadin candidate. 9. Visual hallucinations.  Karen Fowler is an 77 year old who has baseline pretty severe dementia and who lives in a assisted-living facility.  She has been having hallucinations.  She initially had stated that a famous race care driver was coming to take her away and marry her.  She has since switched that to.  I am going to take her away and marry her and she has been treated for urinary tract infection about 10 days ago.  Her exam on admission showed that she was alert, awake and mildly confused.  Chest showed clear.  Heart was regular.  Abdomen was soft. CNS examination was not focal.  She had bruises on her upper extremities bilaterally.  Her white count was 3500, hemoglobin 11.2, hematocrit 32.3 and platelets 194.  Her sodium was 128, BUN 19, creatinine 1.37.  Liver enzymes normal.  Chest x-ray showed COPD.  No acute lung disease, compression deformity of the thoracic and vertebral bodies.  Head CT showed progressive atrophy, stable chronic small-vessel ischemic changes.  HOSPITAL COURSE:  She was felt to have altered mental status perhaps from hyponatremia.  She was fluid restricted given IV fluids and had Neurology consultation.  She remained somewhat confused and continued having delusions despite the improvement in her sodium to  136, BUN improved to 18, creatinine to 1.16.  Urinalysis was negative.  Thyroid studies were negative and Dr. Gerilyn Pilgrim saw her and thought that she had a toxic metabolic encephalopathy.  Plan is for her to be discharged back to her assisted-living facility to continue with her current medications which are lisinopril 20 mg daily, metoprolol succinate 25 mg daily, Namenda 1 tablet b.i.d. 10 mg, amlodipine 5 mg daily, simvastatin 20 mg at bedtime.  She had been on Lasix that will be held for now because of the hyponatremia, nitroglycerin as needed for chest pain 0.4 mg every 5 minutes x3, aspirin 81 mg daily, Os-Cal with D daily.  She had been on potassium chloride and that will be held because she is no longer going to be on the Lasix.  She will need to have fluid restriction of 1000 mL oral fluid restriction daily.  This is markedly improved her sodium level and she had been on Aricept in the past and is not on Aricept now.  I am not sure why, so I am going to restart Aricept 5 mg and has had increased to 10 mg after about a month.     Karen Fowler L. Juanetta Gosling, M.D.     ELH/MEDQ  D:  01/10/2011  T:  01/10/2011  Job:  [id number]  Electronically  Signed by Kari Baars M.D. on 01/23/2011 05:53:00 PM

## 2011-01-23 NOTE — Progress Notes (Signed)
  NAMEKALINDA, ROMANIELLO NO.:  [id number]  MEDICAL RECORD NO.:  [id number]  LOCATION:  A334                          FACILITY:  [facility]  PHYSICIAN:  Munachimso Rigdon L. Juanetta Gosling, M.D.DATE OF BIRTH:  1925-11-20  DATE OF PROCEDURE: DATE OF DISCHARGE:                                PROGRESS NOTE   Ms. Sebring was admitted with change in mental status and hyponatremia. She says she is doing okay this morning.  She looks better more alert. Temperature is 97.7, pulse is 59, respirations are 17 and blood pressure 133/66.  Her chest is clear.  Heart is regular.  She looks back to her baseline now on her sodium which yesterday was 128 is up to 131, potassium is 4.2, BUN 17, creatinine 1.23.  ASSESSMENT:  I think she is better.  PLAN:  She has got a neurological consultation pending.  I am going to have her continue with everything else and see her and she will probably be ready for transfer back to her assisted-living facility fairly soon.     Jood Retana L. Juanetta Gosling, M.D.     ELH/MEDQ  D:  01/09/2011  T:  01/09/2011  Job:  [id number]  Electronically Signed by Kari Baars M.D. on 01/23/2011 05:53:02 PM

## 2011-01-23 NOTE — Progress Notes (Signed)
  NAMESARABI, SOCKWELL NO.:  [id number]  MEDICAL RECORD NO.:  [id number]  LOCATION:  A334                          FACILITY:  [facility]  PHYSICIAN:  Keaghan Staton L. Juanetta Gosling, M.D.DATE OF BIRTH:  15-Jan-1926  DATE OF PROCEDURE: DATE OF DISCHARGE:                                PROGRESS NOTE   Ms. Karen Fowler is overall about the same.  She is still having some confusion, but we do not really have a cause.  Her exam shows her temperature is 97.4, pulse is 54, respirations are 18, blood pressure 109/66 and O2 sats 99% on room air.  She is now with a normal sodium level at 136.  Her BUN is 18, creatinine 1.16.  Her urinalysis was negative.  TSH and free T4 were normal.  She does not have any elevation of liver function testing and her CT is with mildly progressive atrophy, stable chronic small vessel disease.  No acute intracranial findings.  Dr. Ronal Fear help was noted and appreciated. He feels that she probably has some sort of a toxic encephalopathy and I wonder if she is just progressing her dementia.  Another question raised by Dr. Gerilyn Pilgrim was about her Coumadin level and she was not on Coumadin because of multiple falls in the past and also because her followup was very unreliable.  As far as followup now since she is in the skilled care facility, I think she would be more able to follow, but she is still at significant fall risk and I do not think it is in her best interest to start her on Coumadin now.  I will discuss her today with Dr. Gerilyn Pilgrim.  I do not know anything else we need to do in the hospital and see if he thinks she is ready for discharge.  I will plan to go ahead and make preparations in case he feels that she could be discharged home.     Raider Valbuena L. Juanetta Gosling, M.D.     ELH/MEDQ  D:  01/10/2011  T:  01/10/2011  Job:  [id number]  Electronically Signed by Kari Baars M.D. on 01/23/2011 05:53:15 PM

## 2011-05-23 ENCOUNTER — Emergency Department (HOSPITAL_COMMUNITY)
Admission: EM | Admit: 2011-05-23 | Discharge: 2011-05-23 | Disposition: A | Discharge status: Other Acute Inpatient Hospital | Payer: Medicare Other | Attending: Emergency Medicine | Admitting: Emergency Medicine

## 2011-05-23 ENCOUNTER — Encounter (HOSPITAL_COMMUNITY): Payer: Self-pay

## 2011-05-23 ENCOUNTER — Emergency Department (HOSPITAL_COMMUNITY): Payer: Medicare Other

## 2011-05-23 DIAGNOSIS — L0390 Cellulitis, unspecified: Secondary | ICD-10-CM

## 2011-05-23 DIAGNOSIS — E7800 Pure hypercholesterolemia, unspecified: Secondary | ICD-10-CM | POA: Insufficient documentation

## 2011-05-23 DIAGNOSIS — Z951 Presence of aortocoronary bypass graft: Secondary | ICD-10-CM | POA: Insufficient documentation

## 2011-05-23 DIAGNOSIS — Z9079 Acquired absence of other genital organ(s): Secondary | ICD-10-CM | POA: Insufficient documentation

## 2011-05-23 DIAGNOSIS — L03119 Cellulitis of unspecified part of limb: Secondary | ICD-10-CM | POA: Insufficient documentation

## 2011-05-23 DIAGNOSIS — I10 Essential (primary) hypertension: Secondary | ICD-10-CM | POA: Insufficient documentation

## 2011-05-23 DIAGNOSIS — F0390 Unspecified dementia without behavioral disturbance: Secondary | ICD-10-CM | POA: Insufficient documentation

## 2011-05-23 DIAGNOSIS — I2510 Atherosclerotic heart disease of native coronary artery without angina pectoris: Secondary | ICD-10-CM | POA: Insufficient documentation

## 2011-05-23 DIAGNOSIS — Z7982 Long term (current) use of aspirin: Secondary | ICD-10-CM | POA: Insufficient documentation

## 2011-05-23 DIAGNOSIS — E785 Hyperlipidemia, unspecified: Secondary | ICD-10-CM | POA: Insufficient documentation

## 2011-05-23 DIAGNOSIS — M79609 Pain in unspecified limb: Secondary | ICD-10-CM | POA: Insufficient documentation

## 2011-05-23 DIAGNOSIS — L02419 Cutaneous abscess of limb, unspecified: Secondary | ICD-10-CM | POA: Insufficient documentation

## 2011-05-23 DIAGNOSIS — M25559 Pain in unspecified hip: Secondary | ICD-10-CM | POA: Insufficient documentation

## 2011-05-23 DIAGNOSIS — E782 Mixed hyperlipidemia: Secondary | ICD-10-CM | POA: Insufficient documentation

## 2011-05-23 DIAGNOSIS — I080 Rheumatic disorders of both mitral and aortic valves: Secondary | ICD-10-CM | POA: Insufficient documentation

## 2011-05-23 LAB — BASIC METABOLIC PANEL
CO2: 28 mEq/L (ref 19–32)
Calcium: 10.1 mg/dL (ref 8.4–10.5)
Chloride: 95 mEq/L — ABNORMAL LOW (ref 96–112)
Glucose, Bld: 164 mg/dL — ABNORMAL HIGH (ref 70–99)
Potassium: 3.8 mEq/L (ref 3.5–5.1)
Sodium: 133 mEq/L — ABNORMAL LOW (ref 135–145)

## 2011-05-23 LAB — CBC
HCT: 34.1 % — ABNORMAL LOW (ref 36.0–46.0)
MCV: 90 fL (ref 78.0–100.0)
Platelets: 164 10*3/uL (ref 150–400)
RBC: 3.79 MIL/uL — ABNORMAL LOW (ref 3.87–5.11)
WBC: 4.8 10*3/uL (ref 4.0–10.5)

## 2011-05-23 LAB — DIFFERENTIAL
Basophils Absolute: 0 10*3/uL (ref 0.0–0.1)
Eosinophils Relative: 2 % (ref 0–5)
Lymphocytes Relative: 22 % (ref 12–46)
Lymphs Abs: 1 10*3/uL (ref 0.7–4.0)
Neutro Abs: 3.1 10*3/uL (ref 1.7–7.7)

## 2011-05-23 LAB — PROTIME-INR
INR: 0.98 (ref 0.00–1.49)
Prothrombin Time: 13.2 seconds (ref 11.6–15.2)

## 2011-05-23 MED ORDER — HYDROCODONE-ACETAMINOPHEN 5-325 MG PO TABS
1.0000 | ORAL_TABLET | Freq: Once | ORAL | Status: AC
  Administered 2011-05-23: 1 via ORAL
  Filled 2011-05-23: qty 1

## 2011-05-23 MED ORDER — CEPHALEXIN 500 MG PO CAPS
500.0000 mg | ORAL_CAPSULE | Freq: Once | ORAL | Status: AC
  Administered 2011-05-23: 500 mg via ORAL
  Filled 2011-05-23: qty 1

## 2011-05-23 MED ORDER — SULFAMETHOXAZOLE-TMP DS 800-160 MG PO TABS
1.0000 | ORAL_TABLET | Freq: Once | ORAL | Status: AC
  Administered 2011-05-23: 1 via ORAL
  Filled 2011-05-23: qty 1

## 2011-05-23 MED ORDER — CEPHALEXIN 500 MG PO CAPS: 500.0000 mg | ORAL_CAPSULE | Freq: Four times a day (QID) | ORAL | Status: AC

## 2011-05-23 MED ORDER — SULFAMETHOXAZOLE-TRIMETHOPRIM 800-160 MG PO TABS: 1.0000 | ORAL_TABLET | Freq: Two times a day (BID) | ORAL | Status: AC

## 2011-05-23 NOTE — ED Provider Notes (Signed)
History     CSN: [id number] Arrival date & time: 05/23/2011  6:41 PM   First MD Initiated Contact with Patient 05/23/11 1846      Chief Complaint  Patient presents with  . Leg Pain    (Consider location/radiation/quality/duration/timing/severity/associated sxs/prior treatment) HPI Comments: Patient has history dementia is brought in from her nursing home complaining of right leg pain for the past 2 days. She denies any falls or trauma. The nursing home today noticed an area of erythema on her right lateral thigh.  There have been no fevers, vomiting, abdominal pain, chest pain or shortness of breath. There is no bleeding or drainage from the site. Patient is able to bear weight with a limp. She is not taking anything for pain.  The history is provided by the patient and the nursing home.    Past Medical History  Diagnosis Date  . Mitral valve insufficiency and aortic valve insufficiency   . Unspecified essential hypertension   . Other and unspecified hyperlipidemia   . Coronary atherosclerosis of native coronary artery   . Pure hypercholesterolemia   . Postsurgical aortocoronary bypass status   . Hypopotassemia   . Anemia, unspecified   . Other persistent mental disorders due to conditions classified elsewhere   . Mixed hyperlipidemia   . Encounter for long-term (current) use of other medications     Past Surgical History  Procedure Date  . Coronary artery bypass graft   . Abdominal hysterectomy     Family History  Problem Relation Age of Onset  . Coronary artery disease      FAMILY HISTORY    History  Substance Use Topics  . Smoking status: Never Smoker   . Smokeless tobacco: Never Used  . Alcohol Use: No    OB History    Grav Para Term Preterm Abortions TAB SAB Ect Mult Living                  Review of Systems  Unable to perform ROS: Dementia    Allergies  Review of patient's allergies indicates no known allergies.  Home Medications   Current  Outpatient Rx  Name Route Sig Dispense Refill  . AMLODIPINE BESYLATE 5 MG PO TABS Oral Take 5 mg by mouth daily.     . ASPIRIN 81 MG PO TABS Oral Take 81 mg by mouth daily.     Marland Kitchen CALCIUM CARBONATE-VITAMIN D 500-200 MG-UNIT PO TABS Oral Take 1 tablet by mouth daily.     Marland Kitchen MEMANTINE HCL 10 MG PO TABS Oral Take 10 mg by mouth 2 (two) times daily.      Marland Kitchen METOPROLOL SUCCINATE 25 MG PO TB24 Oral Take 25 mg by mouth daily.     Marland Kitchen RISPERIDONE 0.5 MG PO TABS Oral Take 0.5-1 mg by mouth 2 (two) times daily. Take one tablet by mouth every morning and take two tablets every day at bedtime     . SIMVASTATIN 20 MG PO TABS Oral Take 20 mg by mouth at bedtime.     Marland Kitchen VITAMIN B-12 1000 MCG PO TABS Oral Take 1,000 mcg by mouth every morning.      . CEPHALEXIN 500 MG PO CAPS Oral Take 1 capsule (500 mg total) by mouth 4 (four) times daily. 40 capsule 0  . NITROGLYCERIN 0.4 MG SL SUBL Sublingual Place 0.4 mg under the tongue every 5 (five) minutes as needed.     . SULFAMETHOXAZOLE-TRIMETHOPRIM 800-160 MG PO TABS Oral Take 1 tablet by mouth  2 (two) times daily. 20 tablet 0    BP 95/57  Pulse 81  Temp(Src) 98.2 F (36.8 C) (Oral)  Resp 17  Ht 5\' 3"  (1.6 m)  Wt 160 lb (72.576 kg)  BMI 28.34 kg/m2  SpO2 100%  Physical Exam  Constitutional: She is oriented to person, place, and time. She appears well-developed and well-nourished. No distress.  HENT:  Head: Normocephalic and atraumatic.  Mouth/Throat: Oropharynx is clear and moist.  Eyes: Conjunctivae are normal. Pupils are equal, round, and reactive to light.  Neck: Normal range of motion.  Cardiovascular: Normal rate, regular rhythm and normal heart sounds.   Pulmonary/Chest: Effort normal and breath sounds normal. No respiratory distress.  Abdominal: Soft. There is no tenderness. There is no rebound and no guarding.  Musculoskeletal: Normal range of motion. She exhibits no edema and no tenderness.       Full range of motion of the right hip and knee  without pain. No calf tenderness, neurovascular intact distally. Tenderness to palpation over right lateral thigh were area of erythema is.  Neurological: She is alert and oriented to person, place, and time. No cranial nerve deficit.  Skin: Skin is warm.       There is an irregular shaped area of erythema on the right lateral thigh.  There appears to be old puncture wound with dried blood under the skin. There is some induration but no appreciable fluctuance    ED Course  Procedures (including critical care time)  Labs Reviewed  CBC - Abnormal; Notable for the following:    RBC 3.79 (*)    Hemoglobin 11.6 (*)    HCT 34.1 (*)    All other components within normal limits  DIFFERENTIAL - Abnormal; Notable for the following:    Monocytes Relative 13 (*)    All other components within normal limits  BASIC METABOLIC PANEL - Abnormal; Notable for the following:    Sodium 133 (*)    Chloride 95 (*)    Glucose, Bld 164 (*)    GFR calc non Af Amer 50 (*)    GFR calc Af Amer 58 (*)    All other components within normal limits  PROTIME-INR   Dg Pelvis 1-2 Views  05/23/2011  *RADIOLOGY REPORT*  Clinical Data: Right hip pain  PELVIS - 1-2 VIEW  Comparison: None  Findings: Artifact or soft tissue opacity noted over the right upper pelvis.  No displaced pelvic fracture.  Lumbar spine degenerative changes are noted.  IMPRESSION: No acute abnormality.  Artifact or possibly soft tissue opacity over the right upper pelvis.  Original Report Authenticated By: Harrel Lemon, M.D.   Dg Femur Right  05/23/2011  *RADIOLOGY REPORT*  Clinical Data: Right-sided thigh pain, cellulitis  RIGHT FEMUR - 2 VIEW  Comparison: None.  Findings: No fracture identified.  No radiopaque foreign body.  No soft tissue abnormality.  Degenerative changes are partly visualized in the lumbar spine.  Vascular calcifications noted.  IMPRESSION: No acute abnormality.  Original Report Authenticated By: Harrel Lemon, M.D.      1. Cellulitis       MDM  Leg pain with skin lesion noted appears to be consistent with cellulitis. No history of trauma though there does appear to be as old puncture wound. Will take x-rays to evaluate for foreign body.  No evidence of fracture or foreign body. No leukocytosis. We'll treat empirically for cellulitis with Bactrim Keflex. We'll have followup with Dr. Juanetta Gosling early next week.  Glynn Octave, MD 05/23/11 2217

## 2011-05-23 NOTE — ED Notes (Signed)
Pt brought from [facility] with c/o right leg pain. Pt denies injury. Per [facility] pt has not seen PMD for this pain.

## 2011-05-23 NOTE — ED Notes (Signed)
Pt now denies pain.

## 2011-05-23 NOTE — ED Notes (Signed)
[facility] [phone].  They are sending someone to pick her up

## 2011-06-02 ENCOUNTER — Emergency Department (HOSPITAL_COMMUNITY): Payer: Medicare Other

## 2011-06-02 ENCOUNTER — Encounter (HOSPITAL_COMMUNITY): Payer: Self-pay

## 2011-06-02 ENCOUNTER — Inpatient Hospital Stay (HOSPITAL_COMMUNITY)
Admission: EM | Admit: 2011-06-02 | Discharge: 2011-06-09 | DRG: 481 | Disposition: A | Discharge status: Skilled Nursing Facility | Payer: Medicare Other | Attending: Pulmonary Disease | Admitting: Pulmonary Disease

## 2011-06-02 DIAGNOSIS — I10 Essential (primary) hypertension: Secondary | ICD-10-CM

## 2011-06-02 DIAGNOSIS — S72009A Fracture of unspecified part of neck of unspecified femur, initial encounter for closed fracture: Secondary | ICD-10-CM

## 2011-06-02 DIAGNOSIS — I4891 Unspecified atrial fibrillation: Secondary | ICD-10-CM | POA: Diagnosis present

## 2011-06-02 DIAGNOSIS — E785 Hyperlipidemia, unspecified: Secondary | ICD-10-CM

## 2011-06-02 DIAGNOSIS — I25810 Atherosclerosis of coronary artery bypass graft(s) without angina pectoris: Secondary | ICD-10-CM

## 2011-06-02 DIAGNOSIS — I080 Rheumatic disorders of both mitral and aortic valves: Secondary | ICD-10-CM

## 2011-06-02 DIAGNOSIS — I6529 Occlusion and stenosis of unspecified carotid artery: Secondary | ICD-10-CM

## 2011-06-02 DIAGNOSIS — S72143A Displaced intertrochanteric fracture of unspecified femur, initial encounter for closed fracture: Secondary | ICD-10-CM

## 2011-06-02 DIAGNOSIS — D62 Acute posthemorrhagic anemia: Secondary | ICD-10-CM | POA: Diagnosis not present

## 2011-06-02 DIAGNOSIS — W010XXA Fall on same level from slipping, tripping and stumbling without subsequent striking against object, initial encounter: Secondary | ICD-10-CM | POA: Diagnosis present

## 2011-06-02 DIAGNOSIS — F0390 Unspecified dementia without behavioral disturbance: Secondary | ICD-10-CM | POA: Diagnosis present

## 2011-06-02 DIAGNOSIS — I2510 Atherosclerotic heart disease of native coronary artery without angina pectoris: Secondary | ICD-10-CM

## 2011-06-02 HISTORY — DX: Unspecified dementia, unspecified severity, without behavioral disturbance, psychotic disturbance, mood disturbance, and anxiety: F03.90

## 2011-06-02 HISTORY — DX: Essential (primary) hypertension: I10

## 2011-06-02 HISTORY — DX: Displaced intertrochanteric fracture of left femur, initial encounter for closed fracture: S72.142A

## 2011-06-02 HISTORY — DX: Hyperlipidemia, unspecified: E78.5

## 2011-06-02 LAB — CBC
HCT: 31.6 % — ABNORMAL LOW (ref 36.0–46.0)
RBC: 3.54 MIL/uL — ABNORMAL LOW (ref 3.87–5.11)
RDW: 14.3 % (ref 11.5–15.5)
WBC: 7.4 10*3/uL (ref 4.0–10.5)

## 2011-06-02 LAB — DIFFERENTIAL
Basophils Absolute: 0 10*3/uL (ref 0.0–0.1)
Lymphocytes Relative: 6 % — ABNORMAL LOW (ref 12–46)
Monocytes Absolute: 0.6 10*3/uL (ref 0.1–1.0)
Neutro Abs: 6.3 10*3/uL (ref 1.7–7.7)

## 2011-06-02 LAB — COMPREHENSIVE METABOLIC PANEL
Albumin: 3.8 g/dL (ref 3.5–5.2)
CO2: 29 mEq/L (ref 19–32)
Chloride: 88 mEq/L — ABNORMAL LOW (ref 96–112)
GFR calc Af Amer: 30 mL/min — ABNORMAL LOW (ref >90)
Potassium: 4.5 mEq/L (ref 3.5–5.1)
Sodium: 127 mEq/L — ABNORMAL LOW (ref 135–145)

## 2011-06-02 LAB — URINALYSIS, ROUTINE W REFLEX MICROSCOPIC
Ketones, ur: NEGATIVE mg/dL
Leukocytes, UA: NEGATIVE
Nitrite: NEGATIVE
Specific Gravity, Urine: 1.015 (ref 1.005–1.030)
pH: 6 (ref 5.0–8.0)

## 2011-06-02 LAB — PREPARE RBC (CROSSMATCH)

## 2011-06-02 MED ORDER — CALCIUM CARBONATE-VITAMIN D 500-200 MG-UNIT PO TABS
1.0000 | ORAL_TABLET | Freq: Every day | ORAL | Status: DC
  Administered 2011-06-03 – 2011-06-09 (×5): 1 via ORAL
  Filled 2011-06-02 (×6): qty 1

## 2011-06-02 MED ORDER — VITAMIN B-12 1000 MCG PO TABS
1000.0000 ug | ORAL_TABLET | Freq: Every day | ORAL | Status: DC
  Administered 2011-06-03 – 2011-06-09 (×5): 1000 ug via ORAL
  Filled 2011-06-02 (×9): qty 1

## 2011-06-02 MED ORDER — RISPERIDONE 1 MG PO TABS
1.0000 mg | ORAL_TABLET | Freq: Two times a day (BID) | ORAL | Status: DC
  Administered 2011-06-03 – 2011-06-09 (×11): 1 mg via ORAL
  Filled 2011-06-02 (×11): qty 1

## 2011-06-02 MED ORDER — MEMANTINE HCL 10 MG PO TABS
10.0000 mg | ORAL_TABLET | Freq: Two times a day (BID) | ORAL | Status: DC
  Administered 2011-06-03 – 2011-06-09 (×11): 10 mg via ORAL
  Filled 2011-06-02 (×12): qty 1

## 2011-06-02 MED ORDER — SIMVASTATIN 20 MG PO TABS
20.0000 mg | ORAL_TABLET | Freq: Every day | ORAL | Status: DC
  Administered 2011-06-03 – 2011-06-07 (×4): 20 mg via ORAL
  Filled 2011-06-02 (×4): qty 1

## 2011-06-02 MED ORDER — AMLODIPINE BESYLATE 5 MG PO TABS
5.0000 mg | ORAL_TABLET | Freq: Every day | ORAL | Status: DC
  Administered 2011-06-03: 5 mg via ORAL
  Filled 2011-06-02 (×2): qty 1

## 2011-06-02 MED ORDER — ASPIRIN 81 MG PO CHEW
81.0000 mg | CHEWABLE_TABLET | Freq: Every day | ORAL | Status: DC
  Administered 2011-06-03 – 2011-06-09 (×6): 81 mg via ORAL
  Filled 2011-06-02 (×6): qty 1

## 2011-06-02 MED ORDER — SODIUM CHLORIDE 0.9 % IV SOLN: Freq: Once | INTRAVENOUS | Status: DC

## 2011-06-02 MED ORDER — MORPHINE SULFATE 4 MG/ML IJ SOLN
2.0000 mg | Freq: Once | INTRAMUSCULAR | Status: AC
  Administered 2011-06-02: 2 mg via INTRAVENOUS
  Filled 2011-06-02: qty 1

## 2011-06-02 MED ORDER — MORPHINE SULFATE 4 MG/ML IJ SOLN
2.0000 mg | INTRAMUSCULAR | Status: DC | PRN
  Administered 2011-06-03 – 2011-06-06 (×7): 2 mg via INTRAVENOUS
  Filled 2011-06-02 (×7): qty 1

## 2011-06-02 MED ORDER — MORPHINE SULFATE 4 MG/ML IJ SOLN
INTRAMUSCULAR | Status: AC
  Administered 2011-06-02: 2 mg
  Filled 2011-06-02: qty 1

## 2011-06-02 MED ORDER — ACETAMINOPHEN 500 MG PO TABS
500.0000 mg | ORAL_TABLET | ORAL | Status: DC | PRN
  Administered 2011-06-03: 500 mg via ORAL
  Filled 2011-06-02: qty 1

## 2011-06-02 MED ORDER — METOPROLOL TARTRATE 25 MG PO TABS
25.0000 mg | ORAL_TABLET | Freq: Every day | ORAL | Status: DC
  Administered 2011-06-03 – 2011-06-09 (×6): 25 mg via ORAL
  Filled 2011-06-02 (×7): qty 1

## 2011-06-02 MED ORDER — SODIUM CHLORIDE 0.9 % IV SOLN
INTRAVENOUS | Status: DC
  Administered 2011-06-03 – 2011-06-08 (×8): via INTRAVENOUS

## 2011-06-02 NOTE — Consult Note (Signed)
Reason for Consult"  Broken hip Referring Physician: ER physician  Karen Fowler is an 77 y.o. female.  HPI: She fell at [facility] earlier this evening.  She had pain in the left hip and could not stand.  Xrays at hospital show intertrochanteric fracture of the left hip, displaced.  She has no other injury.  She has history of dementia and atrial fib and heart disease.  Past Medical History  Diagnosis Date  . Mitral valve insufficiency and aortic valve insufficiency   . Unspecified essential hypertension   . Other and unspecified hyperlipidemia   . Coronary atherosclerosis of native coronary artery   . Pure hypercholesterolemia   . Postsurgical aortocoronary bypass status   . Hypopotassemia   . Anemia, unspecified   . Other persistent mental disorders due to conditions classified elsewhere   . Mixed hyperlipidemia   . Encounter for long-term (current) use of other medications     Past Surgical History  Procedure Date  . Coronary artery bypass graft   . Abdominal hysterectomy     Family History  Problem Relation Age of Onset  . Coronary artery disease      FAMILY HISTORY    Social History:  reports that she has never smoked. She has never used smokeless tobacco. She reports that she does not drink alcohol or use illicit drugs.  Allergies: No Known Allergies  Medications: I have reviewed the patient's current medications.  Results for orders placed during the hospital encounter of 06/02/11 (from the past 48 hour(s))  COMPREHENSIVE METABOLIC PANEL     Status: Abnormal   Collection Time   06/02/11  5:55 PM      Component Value Range Comment   Sodium 127 (*) 135 - 145 (mEq/L)    Potassium 4.5  3.5 - 5.1 (mEq/L)    Chloride 88 (*) 96 - 112 (mEq/L)    CO2 29  19 - 32 (mEq/L)    Glucose, Bld 149 (*) 70 - 99 (mg/dL)    BUN 23  6 - 23 (mg/dL)    Creatinine, Ser 4.09 (*) 0.50 - 1.10 (mg/dL)    Calcium 81.1  8.4 - 10.5 (mg/dL)    Total Protein 6.8  6.0 - 8.3  (g/dL)    Albumin 3.8  3.5 - 5.2 (g/dL)    AST 25  0 - 37 (U/L)    ALT 22  0 - 35 (U/L)    Alkaline Phosphatase 85  39 - 117 (U/L)    Total Bilirubin 0.3  0.3 - 1.2 (mg/dL)    GFR calc non Af Amer 26 (*) >90 (mL/min)    GFR calc Af Amer 30 (*) >90 (mL/min)   CBC     Status: Abnormal   Collection Time   06/02/11  5:55 PM      Component Value Range Comment   WBC 7.4  4.0 - 10.5 (K/uL)    RBC 3.54 (*) 3.87 - 5.11 (MIL/uL)    Hemoglobin 10.9 (*) 12.0 - 15.0 (g/dL)    HCT 91.4 (*) 78.2 - 46.0 (%)    MCV 89.3  78.0 - 100.0 (fL)    MCH 30.8  26.0 - 34.0 (pg)    MCHC 34.5  30.0 - 36.0 (g/dL)    RDW 95.6  21.3 - 08.6 (%)    Platelets 196  150 - 400 (K/uL)   DIFFERENTIAL     Status: Abnormal   Collection Time   06/02/11  5:55 PM  Component Value Range Comment   Neutrophils Relative 85 (*) 43 - 77 (%)    Neutro Abs 6.3  1.7 - 7.7 (K/uL)    Lymphocytes Relative 6 (*) 12 - 46 (%)    Lymphs Abs 0.5 (*) 0.7 - 4.0 (K/uL)    Monocytes Relative 8  3 - 12 (%)    Monocytes Absolute 0.6  0.1 - 1.0 (K/uL)    Eosinophils Relative 0  0 - 5 (%)    Eosinophils Absolute 0.0  0.0 - 0.7 (K/uL)    Basophils Relative 0  0 - 1 (%)    Basophils Absolute 0.0  0.0 - 0.1 (K/uL)     Dg Chest 1 View  06/02/2011  *RADIOLOGY REPORT*  Clinical Data: Left hip fracture.  Pain after falling.  CHEST - 1 VIEW  Comparison: 01/13/2011  Findings: The patient has had a median sternotomy and CABG.  Heart is mildly enlarged.  The patient is rotated towards the left. There are no focal consolidations or pleural effusions.  There is deformity of the right fifth posterior lateral rib consistent with fracture.  The appearance favors old fracture and correlation is recommended with history of chest injury today.  No evidence for pneumothorax.  Degenerative changes are seen in the spine.  IMPRESSION:  1.  Postoperative changes. 2.  Cardiomegaly without pulmonary edema. 3.  Probable old right rib fracture.  See above.  Original  Report Authenticated By: Patterson Hammersmith, M.D.   Dg Pelvis 1-2 Views  06/02/2011  *RADIOLOGY REPORT*  Clinical Data: Trauma.  Left hip pain after falling.  PELVIS - 1-2 VIEW  Comparison: 05/23/2011  Findings: There is a comminuted intertrochanteric fracture of the left hip, associated with varus angulation.  The femoral head appears located within the acetabulum on this frontal view.  Bones appear osteopenic.  IMPRESSION: Comminuted intertrochanteric fracture of the left hip.  Original Report Authenticated By: Patterson Hammersmith, M.D.   Dg Femur Left  06/02/2011  *RADIOLOGY REPORT*  Clinical Data: Left hip pain after falling today.  LEFT FEMUR - 2 VIEW  Comparison: Pelvis 06/02/2011  Findings: There is a comminuted intertrochanteric fracture of the left hip.  There is associated varus angulation.  The femoral head is located within the acetabulum based on the lateral view. Degenerative changes are seen in the knee.  There is atherosclerotic calcification of the superficial femoral artery. No other fractures are identified.  IMPRESSION: Comminuted intertrochanteric fracture of the left hip.  Original Report Authenticated By: Patterson Hammersmith, M.D.    Review of Systems  Constitutional: Negative.   HENT: Negative.   Eyes: Negative.   Respiratory: Negative.   Cardiovascular: Positive for palpitations.       History of hypertension, heart disease, atrial fib, post bypass.  Seen by cardiology in past.  Gastrointestinal: Negative.   Genitourinary: Negative.   Musculoskeletal: Positive for joint pain (Left hip pain) and falls (Today at rest home.).  Skin: Negative.   Neurological: Negative.   Endo/Heme/Allergies: Negative.   Psychiatric/Behavioral: Positive for memory loss.       History of dementia.   Blood pressure 124/46, pulse 71, temperature 97.9 F (36.6 C), temperature source Oral, resp. rate 20, SpO2 97.00%. Physical Exam  Constitutional: She appears well-developed and  well-nourished.  HENT:  Head: Normocephalic and atraumatic.  Eyes: Conjunctivae and EOM are normal. Pupils are equal, round, and reactive to light.  Neck: Normal range of motion. Neck supple.  Cardiovascular: Intact distal pulses.   Murmur heard.  She has irregular heart rate.  Respiratory: Effort normal and breath sounds normal.  GI: Soft. Bowel sounds are normal.  Musculoskeletal: She exhibits tenderness (pain left hip with any motion.  Shortened, externally rotated.).       Left hip: She exhibits decreased range of motion, tenderness, bony tenderness and deformity.       Legs: Neurological: She is alert. She has normal reflexes.       Oriented to year, not place or person.    Assessment/Plan: Intertrochanteric fracture of the hip on the left, displaced.  I will tentatively schedule for surgery on Wednesday as I feel she may need cardiology consult.  She will be admitted to medicine service.  She has underlying dementia, heart disease, atrial fib.  Karen Fowler 06/02/2011, 7:52 PM

## 2011-06-02 NOTE — ED Notes (Signed)
Dr. Kelling here to eval 

## 2011-06-02 NOTE — ED Notes (Signed)
Pt arrived via EMS for fall witnessed by [employer]. Per EMS pt stumbled and fell. Denies loc. Pt was up against was with her at 45 degree angle per EMS.

## 2011-06-02 NOTE — ED Notes (Signed)
Dr. Freida Busman in and removed pt from lsb and towels from neck

## 2011-06-02 NOTE — ED Notes (Signed)
Pt waiting for bed assignment

## 2011-06-02 NOTE — ED Notes (Signed)
Pt undressed. Moans when moved. edp aware and pain med ordered.

## 2011-06-02 NOTE — ED Provider Notes (Signed)
Scribed for Toy Baker, MD, the patient was seen in room APA08/AP[facility]08 . This chart was scribed by Ellie Lunch.   CSN: [id number] Arrival date & time: 06/02/2011  3:41 PM   First MD Initiated Contact with Patient 06/02/11 1556      Chief Complaint  Patient presents with  . Fall    (Consider location/radiation/quality/duration/timing/severity/associated sxs/prior treatment) Patient is a 77 y.o. female presenting with fall. The history is provided by the EMS personnel and the patient. No language interpreter was used.  Fall   Karen Fowler is a 77 y.o. female brought in by ambulance, who presents to the Emergency Department complaining of fall ~1 hour ago. Pt is a resident of [facility] nursing [facility] and fall was witnessed by staff.  Nurse reports that pt tripped while walking and fell against a wall.  Staff denies Pt hitting head or LOC.  Pt. complains of pain in her left leg. Pain is constant since fall. Pain is aggravated by movement of left leg.  Pt reports no neck pain and no numbness or tingling in arms. Pt has normal ROM in right leg. Pt reports no abdominal pain.       Past Medical History  Diagnosis Date  . Mitral valve insufficiency and aortic valve insufficiency   . Unspecified essential hypertension   . Other and unspecified hyperlipidemia   . Coronary atherosclerosis of native coronary artery   . Pure hypercholesterolemia   . Postsurgical aortocoronary bypass status   . Hypopotassemia   . Anemia, unspecified   . Other persistent mental disorders due to conditions classified elsewhere   . Mixed hyperlipidemia   . Encounter for long-term (current) use of other medications     Past Surgical History  Procedure Date  . Coronary artery bypass graft   . Abdominal hysterectomy     Family History  Problem Relation Age of Onset  . Coronary artery disease      FAMILY HISTORY    History  Substance Use Topics  . Smoking status: Never Smoker   . Smokeless  tobacco: Never Used  . Alcohol Use: No     Review of Systems 10 Systems reviewed and are negative for acute change except as noted in the HPI.   Allergies  Review of patient's allergies indicates no known allergies.  Home Medications   Current Outpatient Rx  Name Route Sig Dispense Refill  . AMLODIPINE BESYLATE 5 MG PO TABS Oral Take 5 mg by mouth daily.     . ASPIRIN 81 MG PO TABS Oral Take 81 mg by mouth daily.     Marland Kitchen CALCIUM CARBONATE-VITAMIN D 500-200 MG-UNIT PO TABS Oral Take 1 tablet by mouth daily.     . CEPHALEXIN 500 MG PO CAPS Oral Take 1 capsule (500 mg total) by mouth 4 (four) times daily. 40 capsule 0  . MEMANTINE HCL 10 MG PO TABS Oral Take 10 mg by mouth 2 (two) times daily.      Marland Kitchen METOPROLOL SUCCINATE 25 MG PO TB24 Oral Take 25 mg by mouth daily.     Marland Kitchen NITROGLYCERIN 0.4 MG SL SUBL Sublingual Place 0.4 mg under the tongue every 5 (five) minutes as needed.     Marland Kitchen RISPERIDONE 0.5 MG PO TABS Oral Take 0.5-1 mg by mouth 2 (two) times daily. Take one tablet by mouth every morning and take two tablets every day at bedtime     . SIMVASTATIN 20 MG PO TABS Oral Take 20 mg by mouth at  bedtime.     . SULFAMETHOXAZOLE-TRIMETHOPRIM 800-160 MG PO TABS Oral Take 1 tablet by mouth 2 (two) times daily. 20 tablet 0  . VITAMIN B-12 1000 MCG PO TABS Oral Take 1,000 mcg by mouth every morning.        BP 104/46  Pulse 71  Temp(Src) 97.5 F (36.4 C) (Oral)  Resp 20  SpO2 97%  Physical Exam  Nursing note and vitals reviewed. Constitutional: She appears well-developed and well-nourished.  HENT:  Head: Normocephalic and atraumatic.  Neck: Normal range of motion. Neck supple.  Cardiovascular: Normal rate and regular rhythm.   Pulmonary/Chest: Effort normal. No respiratory distress.  Abdominal: Soft. There is no tenderness.  Musculoskeletal:       Cervical back: She exhibits no tenderness.       Thoracic back: She exhibits no tenderness.       Lumbar back: She exhibits no tenderness.        Distal left femur tenderness. Full ROM left knee.  Neurological: She is alert.  Skin: Skin is warm and dry.    ED Course  Procedures (including critical care time) DIAGNOSTIC STUDIES: Oxygen Saturation is 97% on room air, normal by my interpretation.    COORDINATION OF CARE:  Labs Reviewed  COMPREHENSIVE METABOLIC PANEL - Abnormal; Notable for the following:    Sodium 127 (*)    Chloride 88 (*)    Glucose, Bld 149 (*)    Creatinine, Ser 1.76 (*)    GFR calc non Af Amer 26 (*)    GFR calc Af Amer 30 (*)    All other components within normal limits  CBC - Abnormal; Notable for the following:    RBC 3.54 (*)    Hemoglobin 10.9 (*)    HCT 31.6 (*)    All other components within normal limits  DIFFERENTIAL - Abnormal; Notable for the following:    Neutrophils Relative 85 (*)    Lymphocytes Relative 6 (*)    Lymphs Abs 0.5 (*)    All other components within normal limits  URINALYSIS, ROUTINE W REFLEX MICROSCOPIC    Dg Chest 1 View  06/02/2011  *RADIOLOGY REPORT*  Clinical Data: Left hip fracture.  Pain after falling.  CHEST - 1 VIEW  Comparison: 01/13/2011  Findings: The patient has had a median sternotomy and CABG.  Heart is mildly enlarged.  The patient is rotated towards the left. There are no focal consolidations or pleural effusions.  There is deformity of the right fifth posterior lateral rib consistent with fracture.  The appearance favors old fracture and correlation is recommended with history of chest injury today.  No evidence for pneumothorax.  Degenerative changes are seen in the spine.  IMPRESSION:  1.  Postoperative changes. 2.  Cardiomegaly without pulmonary edema. 3.  Probable old right rib fracture.  See above.  Original Report Authenticated By: Patterson Hammersmith, M.D.   Dg Pelvis 1-2 Views  06/02/2011  *RADIOLOGY REPORT*  Clinical Data: Trauma.  Left hip pain after falling.  PELVIS - 1-2 VIEW  Comparison: 05/23/2011  Findings: There is a comminuted  intertrochanteric fracture of the left hip, associated with varus angulation.  The femoral head appears located within the acetabulum on this frontal view.  Bones appear osteopenic.  IMPRESSION: Comminuted intertrochanteric fracture of the left hip.  Original Report Authenticated By: Patterson Hammersmith, M.D.   Dg Femur Left  06/02/2011  *RADIOLOGY REPORT*  Clinical Data: Left hip pain after falling today.  LEFT FEMUR - 2  VIEW  Comparison: Pelvis 06/02/2011  Findings: There is a comminuted intertrochanteric fracture of the left hip.  There is associated varus angulation.  The femoral head is located within the acetabulum based on the lateral view. Degenerative changes are seen in the knee.  There is atherosclerotic calcification of the superficial femoral artery. No other fractures are identified.  IMPRESSION: Comminuted intertrochanteric fracture of the left hip.  Original Report Authenticated By: Patterson Hammersmith, M.D.     No diagnosis found.    MDM   Date: 06/02/2011  Rate: 70  Rhythm: atrial fibrillation  QRS Axis: normal  Intervals: normal  ST/T Wave abnormalities: nonspecific ST/T changes  Conduction Disutrbances:none  Narrative Interpretation:   Old EKG Reviewed: none available    I personally performed the services described in this documentation, which was scribed in my presence. The recorded information has been reviewed and considered.       8:30 PM Pt seen ortho, request medial clearence by medicine, spoke with dr. Sudie Bailey, will admit  Toy Baker, MD 06/02/11 2030

## 2011-06-03 ENCOUNTER — Encounter (HOSPITAL_COMMUNITY): Payer: Self-pay | Admitting: Orthopaedic Surgery

## 2011-06-03 DIAGNOSIS — Z01810 Encounter for preprocedural cardiovascular examination: Secondary | ICD-10-CM

## 2011-06-03 DIAGNOSIS — I2510 Atherosclerotic heart disease of native coronary artery without angina pectoris: Secondary | ICD-10-CM

## 2011-06-03 LAB — CBC
Hemoglobin: 9 g/dL — ABNORMAL LOW (ref 12.0–15.0)
MCH: 30 pg (ref 26.0–34.0)
RBC: 3 MIL/uL — ABNORMAL LOW (ref 3.87–5.11)

## 2011-06-03 LAB — BASIC METABOLIC PANEL
CO2: 27 mEq/L (ref 19–32)
Glucose, Bld: 107 mg/dL — ABNORMAL HIGH (ref 70–99)
Potassium: 4.2 mEq/L (ref 3.5–5.1)
Sodium: 129 mEq/L — ABNORMAL LOW (ref 135–145)

## 2011-06-03 LAB — LIPID PANEL
Cholesterol: 135 mg/dL (ref 0–200)
Triglycerides: 67 mg/dL (ref <150)

## 2011-06-03 MED ORDER — SODIUM CHLORIDE 0.9 % IJ SOLN
INTRAMUSCULAR | Status: AC
  Administered 2011-06-03: 10 mL
  Filled 2011-06-03: qty 3

## 2011-06-03 MED ORDER — MEMANTINE HCL 10 MG PO TABS
ORAL_TABLET | ORAL | Status: AC
  Administered 2011-06-03: 10 mg via ORAL
  Filled 2011-06-03: qty 1

## 2011-06-03 MED ORDER — RISPERIDONE 1 MG PO TABS
ORAL_TABLET | ORAL | Status: AC
  Administered 2011-06-03: 1 mg via ORAL
  Filled 2011-06-03: qty 1

## 2011-06-03 MED ORDER — SODIUM CHLORIDE 0.9 % IJ SOLN
INTRAMUSCULAR | Status: AC
  Filled 2011-06-03: qty 3

## 2011-06-03 MED ORDER — CEFAZOLIN SODIUM 1-5 GM-% IV SOLN
1.0000 g | Freq: Once | INTRAVENOUS | Status: DC
  Filled 2011-06-03: qty 50

## 2011-06-03 MED ORDER — POVIDONE-IODINE 10 % EX SOLN
Freq: Once | CUTANEOUS | Status: AC
  Administered 2011-06-03: 21:00:00 via TOPICAL

## 2011-06-03 MED ORDER — MORPHINE SULFATE 4 MG/ML IJ SOLN
INTRAMUSCULAR | Status: AC
  Administered 2011-06-03: 2 mg via INTRAVENOUS
  Filled 2011-06-03: qty 1

## 2011-06-03 MED ORDER — POVIDONE-IODINE 10 % EX OINT
TOPICAL_OINTMENT | CUTANEOUS | Status: AC
  Administered 2011-06-03: 22:00:00
  Filled 2011-06-03: qty 1

## 2011-06-03 NOTE — Progress Notes (Signed)
She is afebrile and vital signs are normal.  She is to have transfusion of red blood cells as her HGB dropped to 9.0 today.  She was dehydrated and has anemia plus she most likely lost a half to full unit secondary to the fracture.  I await final clearance for surgery. It is scheduled to tomorrow at noon.  She is confused.

## 2011-06-03 NOTE — H&P (Signed)
Karen Fowler MRN: [id number] DOB/AGE: 08/16/1925 77 y.o. Primary Care Physician:Elicia Lui L, MD Admit date: 06/02/2011 Chief Complaint: Hip fracture HPI: This is an 77 year old with dementia. She fell at her assisted-living facility and complained of pain in her left hip. She was brought to the emergency room and found to have a intertrochanteric left hip fracture. She has a history of multiple medical problems as listed below. She has been found to be in atrial fibrillation.  Past Medical History  Diagnosis Date  . Mitral valve insufficiency and aortic valve insufficiency   . Unspecified essential hypertension   . Other and unspecified hyperlipidemia   . Coronary atherosclerosis of native coronary artery   . Pure hypercholesterolemia   . Postsurgical aortocoronary bypass status   . Hypopotassemia   . Anemia, unspecified   . Other persistent mental disorders due to conditions classified elsewhere   . Mixed hyperlipidemia   . Encounter for long-term (current) use of other medications   . Closed intertrochanteric fracture of left hip    Past Surgical History  Procedure Date  . Coronary artery bypass graft   . Abdominal hysterectomy         Family History  Problem Relation Age of Onset  . Coronary artery disease      FAMILY HISTORY    Social History:  reports that she has never smoked. She has never used smokeless tobacco. She reports that she does not drink alcohol or use illicit drugs. She had been living with family but is now living in an assisted living facility I believe at the direction of [facility] social service  Allergies: No Known Allergies  Medications Prior to Admission  Medication Dose Route Frequency Provider Last Rate Last Dose  . 0.9 %  sodium chloride infusion   Intravenous Continuous Milana Obey 75 mL/hr at 06/03/11 4098    . acetaminophen (TYLENOL) tablet 500 mg  500 mg Oral Q4H PRN Milana Obey      . amLODipine (NORVASC)  tablet 5 mg  5 mg Oral Daily Milana Obey      . aspirin chewable tablet 81 mg  81 mg Oral Daily Milana Obey      . calcium-vitamin D (OSCAL WITH D) 500-200 MG-UNIT per tablet 1 tablet  1 tablet Oral Daily Milana Obey      . memantine (NAMENDA) tablet 10 mg  10 mg Oral BID Mila Homer Knowlton   10 mg at 06/03/11 0030  . metoprolol tartrate (LOPRESSOR) tablet 25 mg  25 mg Oral Daily Milana Obey      . morphine 4 MG/ML injection 2 mg  2 mg Intravenous Once Toy Baker, MD   2 mg at 06/02/11 2129  . morphine 4 MG/ML injection 2 mg  2 mg Intravenous Q3H PRN Milana Obey   2 mg at 06/03/11 1191  . morphine 4 MG/ML injection        2 mg at 06/02/11 1907  . risperiDONE (RISPERDAL) tablet 1 mg  1 mg Oral BID Mila Homer Knowlton   1 mg at 06/03/11 0030  . simvastatin (ZOCOR) tablet 20 mg  20 mg Oral q1800 Milana Obey      . vitamin B-12 (CYANOCOBALAMIN) tablet 1,000 mcg  1,000 mcg Oral Daily Milana Obey      . DISCONTD: 0.9 %  sodium chloride infusion   Intravenous Once Toy Baker, MD       Medications Prior to Admission  Medication Sig Dispense Refill  . amLODipine (NORVASC) 5 MG tablet Take 5 mg by mouth daily.       . calcium-vitamin D (OSCAL WITH D) 500-200 MG-UNIT per tablet Take 1 tablet by mouth daily.       . cephALEXin (KEFLEX) 500 MG capsule Take 1 capsule (500 mg total) by mouth 4 (four) times daily.  40 capsule  0  . memantine (NAMENDA) 10 MG tablet Take 10 mg by mouth 2 (two) times daily.        . risperiDONE (RISPERDAL) 0.5 MG tablet Take 1 mg by mouth 2 (two) times daily. Take one tablet by mouth every morning and take two tablets every day at bedtime      . simvastatin (ZOCOR) 20 MG tablet Take 20 mg by mouth at bedtime.       . sulfamethoxazole-trimethoprim (SEPTRA DS) 800-160 MG per tablet Take 1 tablet by mouth 2 (two) times daily.  20 tablet  0  . vitamin B-12 (CYANOCOBALAMIN) 1000 MCG tablet Take 1,000 mcg by mouth every morning.         . nitroGLYCERIN (NITROSTAT) 0.4 MG SL tablet Place 0.4 mg under the tongue every 5 (five) minutes as needed.            ZOX:WRUEA from the symptoms mentioned above,there are no other symptoms referable to all systems reviewed.  Physical Exam: Blood pressure 102/64, pulse 72, temperature 98.2 F (36.8 C), temperature source Oral, resp. rate 20, height 5\' 3"  (1.6 m), weight 66.9 kg (147 lb 7.8 oz), SpO2 93.00%. She is awake and alert but confused is unable to really provide any history. Her pupils are reactive. Her mucous membranes are slightly dry. Her neck is supple without masses. Her chest shows bilateral rhonchi. Her heart is irregular with a systolic murmur. Her abdomen is soft without masses. Her extremities showed no clubbing cyanosis or edema. Percent on nervous system examination shows a she's confused but has no focal findings.    Basename 06/03/11 0506 06/02/11 1755  WBC 7.3 7.4  NEUTROABS -- 6.3  HGB 9.0* 10.9*  HCT 26.9* 31.6*  MCV 89.7 89.3  PLT 157 196    Basename 06/03/11 0506 06/02/11 1755  NA 129* 127*  K 4.2 4.5  CL 94* 88*  CO2 27 29  GLUCOSE 107* 149*  BUN 17 23  CREATININE 1.39* 1.76*  CALCIUM 9.2 10.5  MG -- --  lablast2(ast:2,ALT:2,alkphos:2,bilitot:2,prot:2,albumin:2)@    No results found for this or any previous visit (from the past 240 hour(s)).   Dg Chest 1 View  06/02/2011  *RADIOLOGY REPORT*  Clinical Data: Left hip fracture.  Pain after falling.  CHEST - 1 VIEW  Comparison: 01/13/2011  Findings: The patient has had a median sternotomy and CABG.  Heart is mildly enlarged.  The patient is rotated towards the left. There are no focal consolidations or pleural effusions.  There is deformity of the right fifth posterior lateral rib consistent with fracture.  The appearance favors old fracture and correlation is recommended with history of chest injury today.  No evidence for pneumothorax.  Degenerative changes are seen in the spine.   IMPRESSION:  1.  Postoperative changes. 2.  Cardiomegaly without pulmonary edema. 3.  Probable old right rib fracture.  See above.  Original Report Authenticated By: Patterson Hammersmith, M.D.   Dg Pelvis 1-2 Views  06/02/2011  *RADIOLOGY REPORT*  Clinical Data: Trauma.  Left hip pain after falling.  PELVIS - 1-2 VIEW  Comparison: 05/23/2011  Findings: There is a comminuted intertrochanteric fracture of the left hip, associated with varus angulation.  The femoral head appears located within the acetabulum on this frontal view.  Bones appear osteopenic.  IMPRESSION: Comminuted intertrochanteric fracture of the left hip.  Original Report Authenticated By: Patterson Hammersmith, M.D.   Dg Pelvis 1-2 Views  05/23/2011  *RADIOLOGY REPORT*  Clinical Data: Right hip pain  PELVIS - 1-2 VIEW  Comparison: None  Findings: Artifact or soft tissue opacity noted over the right upper pelvis.  No displaced pelvic fracture.  Lumbar spine degenerative changes are noted.  IMPRESSION: No acute abnormality.  Artifact or possibly soft tissue opacity over the right upper pelvis.  Original Report Authenticated By: Harrel Lemon, M.D.   Dg Femur Left  06/02/2011  *RADIOLOGY REPORT*  Clinical Data: Left hip pain after falling today.  LEFT FEMUR - 2 VIEW  Comparison: Pelvis 06/02/2011  Findings: There is a comminuted intertrochanteric fracture of the left hip.  There is associated varus angulation.  The femoral head is located within the acetabulum based on the lateral view. Degenerative changes are seen in the knee.  There is atherosclerotic calcification of the superficial femoral artery. No other fractures are identified.  IMPRESSION: Comminuted intertrochanteric fracture of the left hip.  Original Report Authenticated By: Patterson Hammersmith, M.D.   Dg Femur Right  05/23/2011  *RADIOLOGY REPORT*  Clinical Data: Right-sided thigh pain, cellulitis  RIGHT FEMUR - 2 VIEW  Comparison: None.  Findings: No fracture identified.  No  radiopaque foreign body.  No soft tissue abnormality.  Degenerative changes are partly visualized in the lumbar spine.  Vascular calcifications noted.  IMPRESSION: No acute abnormality.  Original Report Authenticated By: Harrel Lemon, M.D.   Impression: She has a hip fracture. She has dementia which is pretty severe. She has history of coronary artery occlusive disease status post bypass. She has atrial fibrillation. Active Problems:  * No active hospital problems. *      Plan: Dr. Hilda Lias plans to do surgery tomorrow I agree we should probably get cardiology consultation.      Karen Fowler 06/03/2011, 8:27 AM

## 2011-06-03 NOTE — Consult Note (Signed)
CARDIOLOGY CONSULT NOTE  Fowler ID: Karen Fowler MRN: [id number] DOB/AGE: 75-Oct-1927 75 y.o.  Admit date: 06/02/2011 Referring Physician:Hawkins Primary PhysicianHAWKINS,EDWARD L, MD Primary Cardiologist:DeGent Reason for Consultation: Pre-Op Evaluation for hip fx Active Problems:  CAD, ARTERY BYPASS GRAFT  CAROTID ARTERY DISEASE  Atrial fibrillation  HPI: Karen Fowler is a 77 year old female Fowler of Dr.DeGent with a history of coronary artery disease, status post coronary bypass grafting with normal LV function. She also has mild cerebrovascular disease. She also has mild mitral regurgitation hypertension dyslipidemia a significant degree of dementia.  Karen Fowler had a stress test in 2010 which was negative for ischemia. Carotid Dopplers were also done in 2010 revealing 60-79% stenosis of Karen left internal carotid artery and 40-59% of Karen right internal carotid artery. She was last seen by Dr. Andee Lineman 12/09/2010.  She lives at [facility] and sustained a left hip  intertrochanteric fracture after a witnessed fall.  She was walking and subsequently fell against a wall after tripping. Of note, she was found to be anemic with Hgb of 9.0. She received one unit of PRBC's in  ER. She was also found to be hyponatriemic with Na of 129. She is receiving IV fluid replacement. She is not on diuretics.   Secondary to multiple CVRF's we are asked for pre-operative cardiac evaluation.  She is to have surgery am of 06/04/2011 per Dr. Hilda Lias. History is obtained from past and current records, as she does not remember event, along with being poor historian in setting of dementia.   Review of systems complete and found to be negative unless listed above   Past Medical History  Diagnosis Date  . Mitral valve insufficiency and aortic valve insufficiency   . Unspecified essential hypertension   . Other and unspecified hyperlipidemia   . Coronary atherosclerosis of native coronary  artery   . Pure hypercholesterolemia   . Postsurgical aortocoronary bypass status   . Hypopotassemia   . Anemia, unspecified   . Other persistent mental disorders due to conditions classified elsewhere   . Mixed hyperlipidemia   . Encounter for long-term (current) use of other medications   . Closed intertrochanteric fracture of left hip     Family History  Problem Relation Age of Onset  . Coronary artery disease      FAMILY HISTORY    History   Social History  . Marital Status: Widowed    Spouse Name: N/A    Number of Children: N/A  . Years of Education: N/A   Occupational History  . RETIRED    Social History Main Topics  . Smoking status: Never Smoker   . Smokeless tobacco: Never Used  . Alcohol Use: No  . Drug Use: No  . Sexually Active: No   Past Surgical History  Procedure Date  . Coronary artery bypass graft   . Abdominal hysterectomy      Prescriptions prior to admission  Medication Sig Dispense Refill  . amLODipine (NORVASC) 5 MG tablet Take 5 mg by mouth daily.       Marland Kitchen aspirin EC 81 MG tablet Take 81 mg by mouth daily.        . calcium-vitamin D (OSCAL WITH D) 500-200 MG-UNIT per tablet Take 1 tablet by mouth daily.       . cephALEXin (KEFLEX) 500 MG capsule Take 1 capsule (500 mg total) by mouth 4 (four) times daily.  40 capsule  0  . memantine (NAMENDA) 10 MG tablet Take 10 mg  by mouth 2 (two) times daily.        . metoprolol tartrate (LOPRESSOR) 25 MG tablet Take 25 mg by mouth every morning.        . risperiDONE (RISPERDAL) 0.5 MG tablet Take 1 mg by mouth 2 (two) times daily. Take one tablet by mouth every morning and take two tablets every day at bedtime      . simvastatin (ZOCOR) 20 MG tablet Take 20 mg by mouth at bedtime.       . sulfamethoxazole-trimethoprim (SEPTRA DS) 800-160 MG per tablet Take 1 tablet by mouth 2 (two) times daily.  20 tablet  0  . vitamin B-12 (CYANOCOBALAMIN) 1000 MCG tablet Take 1,000 mcg by mouth every morning.        .  nitroGLYCERIN (NITROSTAT) 0.4 MG SL tablet Place 0.4 mg under Karen tongue every 5 (five) minutes as needed.       Physical Exam: Blood pressure 110/50, pulse 82, temperature 99.4 F (37.4 C), temperature source Oral, resp. rate 20, height 5\' 3"  (1.6 m), weight 147 lb 7.8 oz (66.9 kg), SpO2 93.00%.  General: Well developed, well nourished, in no acute distress. Somnolent. Head: Eyes PERRLA, No xanthomas.   Normal cephalic and atramatic  Lungs: Clear bilaterally to auscultation and percussion. Heart: HRIR with 1/6 systolic murmur and S4.  Pulses are 2+ & equal.            Right carotid bruit. No JVD.  No abdominal bruits. No femoral bruits. Abdomen: Bowel sounds are positive, abdomen soft and non-tender without masses or                  Hernia's noted. Msk:  Back-kyphosis. Diminished strength and tone for age. Extremities: No clubbing, cyanosis or edema.  Left leg in [place] traction; trace pedal edema Neuro: Alert  Psych:  Flat affect, responds slowly, poor historian.   Lab Results  Component Value Date   WBC 7.3 06/03/2011   HGB 9.0* 06/03/2011   HCT 26.9* 06/03/2011   MCV 89.7 06/03/2011   PLT 157 06/03/2011     Lab 06/03/11 0506 06/02/11 1755  NA 129* --  K 4.2 --  CL 94* --  CO2 27 --  BUN 17 --  CREATININE 1.39* --  CALCIUM 9.2 --  PROT -- 6.8  BILITOT -- 0.3  ALKPHOS -- 85  ALT -- 22  AST -- 25  GLUCOSE 107* --     Radiology: Dg Chest 1 View  06/02/2011  *RADIOLOGY REPORT*  Clinical Data: Left hip fracture.  Pain after falling.  CHEST - 1 VIEW   IMPRESSION:  1.  Postoperative changes. 2.  Cardiomegaly without pulmonary edema. 3.  Probable old right rib fracture.  See above.  Original Report Authenticated By: Patterson Hammersmith, M.D.   EKG: Sinus rhythm with sinus arrhythmia and PACs; nonspecific ST-T wave abnormalities, which are new when compared with previous tracing performed 01/13/2011.  ASSESSMENT AND PLAN:   1. CAD:  CABG surgery in 2006 with no subsequent  manifestations of ischemic heart disease. Records obtained including previous progress notes and EKGs and reviewed.  Most recent stress test 2010 was negative for ischemia.  EKG shows nonspecific ST-T wave abnormalities. Would continue BB and amlodipine for HR and BP control.  2. Cerebrovascular disease: Moderately severe extracted disease when last assessed 2 years ago. Karen risk benefit ratio for surgical or interventional treatment of carotid stenosis is unfavorable in this elderly woman with dementia and multiple medical problems.  Continued medical therapy is appropriate.  3. Anemia: She has history of anemia, but etiology is unspecified.  Blood transfusion in progress to correct moderate anemia. Karen extent to which her low hemoglobin and hematocrit are due to acute blood loss related to fracture is unclear. Additional evaluation of chronic anemia may be warranted.  Bettey Mare. Lyman Bishop NP [facility] 06/03/2011, 4:17 PM    Cardiology Attending Fowler interviewed and examined. Discussed with Joni Reining, NP.  Above note annotated and modified based upon my findings.  EKG was interpreted by computer algorithm as showing atrial fibrillation, but this is not Karen case. No prior EKG has demonstrated arrhythmia.     With both coronary and cerebrovascular disease, dementia and advanced age, her risk for surgical intervention to repair hip fracture is substantially increased; however, she will no longer be capable of ambulating if this operation is not undertaken. No additional testing or treatment will reduce perioperative risk for complications. I recommend proceeding with Karen Fowler's family being informed of her increased likelihood for adverse perioperative events. We will be happy to follow to assist with medical and cardiology care.  [place]ranton Bing, MD 06/03/2011, 7:32 PM

## 2011-06-04 ENCOUNTER — Inpatient Hospital Stay (HOSPITAL_COMMUNITY): Payer: Medicare Other

## 2011-06-04 ENCOUNTER — Encounter (HOSPITAL_COMMUNITY)
Admission: EM | Disposition: A | Discharge status: Skilled Nursing Facility | Payer: Self-pay | Source: Home / Self Care | Attending: Pulmonary Disease

## 2011-06-04 ENCOUNTER — Encounter (HOSPITAL_COMMUNITY): Payer: Self-pay | Admitting: Anesthesiology

## 2011-06-04 ENCOUNTER — Inpatient Hospital Stay (HOSPITAL_COMMUNITY): Payer: Medicare Other | Admitting: Anesthesiology

## 2011-06-04 DIAGNOSIS — S72143A Displaced intertrochanteric fracture of unspecified femur, initial encounter for closed fracture: Secondary | ICD-10-CM | POA: Diagnosis present

## 2011-06-04 HISTORY — PX: ORIF HIP FRACTURE: SHX2125

## 2011-06-04 LAB — DIFFERENTIAL
Basophils Absolute: 0 10*3/uL (ref 0.0–0.1)
Basophils Relative: 0 % (ref 0–1)
Eosinophils Absolute: 0.1 10*3/uL (ref 0.0–0.7)
Lymphs Abs: 0.8 10*3/uL (ref 0.7–4.0)
Neutrophils Relative %: 76 % (ref 43–77)

## 2011-06-04 LAB — BASIC METABOLIC PANEL
GFR calc Af Amer: 44 mL/min — ABNORMAL LOW (ref >90)
GFR calc non Af Amer: 38 mL/min — ABNORMAL LOW (ref >90)
Glucose, Bld: 103 mg/dL — ABNORMAL HIGH (ref 70–99)
Potassium: 4 mEq/L (ref 3.5–5.1)
Sodium: 131 mEq/L — ABNORMAL LOW (ref 135–145)

## 2011-06-04 LAB — PRO B NATRIURETIC PEPTIDE: Pro B Natriuretic peptide (BNP): 1284 pg/mL — ABNORMAL HIGH (ref 0–450)

## 2011-06-04 LAB — CBC
MCH: 30.9 pg (ref 26.0–34.0)
Platelets: 141 10*3/uL — ABNORMAL LOW (ref 150–400)
RBC: 4.17 MIL/uL (ref 3.87–5.11)
WBC: 7.8 10*3/uL (ref 4.0–10.5)

## 2011-06-04 SURGERY — OPEN REDUCTION INTERNAL FIXATION HIP
Anesthesia: Spinal | Site: Hip | Laterality: Left | Wound class: Clean

## 2011-06-04 MED ORDER — AMLODIPINE BESYLATE 5 MG PO TABS
5.0000 mg | ORAL_TABLET | Freq: Every day | ORAL | Status: DC
  Administered 2011-06-05 – 2011-06-09 (×5): 5 mg via ORAL
  Filled 2011-06-04 (×4): qty 1

## 2011-06-04 MED ORDER — VITAMIN B-12 1000 MCG PO TABS
1000.0000 ug | ORAL_TABLET | ORAL | Status: DC
  Administered 2011-06-05: 1000 ug via ORAL
  Filled 2011-06-04 (×2): qty 1

## 2011-06-04 MED ORDER — BIOTENE DRY MOUTH MT LIQD
15.0000 mL | Freq: Two times a day (BID) | OROMUCOSAL | Status: DC
  Administered 2011-06-04 – 2011-06-09 (×10): 15 mL via OROMUCOSAL

## 2011-06-04 MED ORDER — LIDOCAINE HCL 1 % IJ SOLN
INTRAMUSCULAR | Status: DC | PRN
  Administered 2011-06-04: 30 mg via INTRADERMAL

## 2011-06-04 MED ORDER — FENTANYL CITRATE 0.05 MG/ML IJ SOLN: 25.0000 ug | INTRAMUSCULAR | Status: DC | PRN

## 2011-06-04 MED ORDER — MAGNESIUM HYDROXIDE 400 MG/5ML PO SUSP: 30.0000 mL | Freq: Every day | ORAL | Status: DC | PRN

## 2011-06-04 MED ORDER — FENTANYL CITRATE 0.05 MG/ML IJ SOLN
INTRAMUSCULAR | Status: DC | PRN
  Administered 2011-06-04 (×2): 25 ug via INTRAVENOUS

## 2011-06-04 MED ORDER — ACETAMINOPHEN 325 MG PO TABS
650.0000 mg | ORAL_TABLET | Freq: Four times a day (QID) | ORAL | Status: DC | PRN
  Administered 2011-06-08: 650 mg via ORAL
  Filled 2011-06-04: qty 2

## 2011-06-04 MED ORDER — PNEUMOCOCCAL VAC POLYVALENT 25 MCG/0.5ML IJ INJ
0.5000 mL | INJECTION | INTRAMUSCULAR | Status: AC
  Administered 2011-06-05: 0.5 mL via INTRAMUSCULAR
  Filled 2011-06-04: qty 0.5

## 2011-06-04 MED ORDER — EPHEDRINE SULFATE 50 MG/ML IJ SOLN
INTRAMUSCULAR | Status: DC | PRN
  Administered 2011-06-04: 15 mg via INTRAVENOUS
  Administered 2011-06-04: 10 mg via INTRAVENOUS
  Administered 2011-06-04 (×2): 5 mg via INTRAVENOUS

## 2011-06-04 MED ORDER — BUPIVACAINE IN DEXTROSE 0.75-8.25 % IT SOLN
INTRATHECAL | Status: DC | PRN
  Administered 2011-06-04: 15 mg via INTRATHECAL

## 2011-06-04 MED ORDER — METOPROLOL TARTRATE 25 MG PO TABS
25.0000 mg | ORAL_TABLET | ORAL | Status: DC
  Administered 2011-06-05: 25 mg via ORAL
  Filled 2011-06-04: qty 1

## 2011-06-04 MED ORDER — SULFAMETHOXAZOLE-TMP DS 800-160 MG PO TABS
1.0000 | ORAL_TABLET | Freq: Two times a day (BID) | ORAL | Status: DC
  Administered 2011-06-04 – 2011-06-09 (×10): 1 via ORAL
  Filled 2011-06-04 (×10): qty 1

## 2011-06-04 MED ORDER — PROPOFOL 10 MG/ML IV EMUL
INTRAVENOUS | Status: DC | PRN
  Administered 2011-06-04: 25 ug/kg/min via INTRAVENOUS

## 2011-06-04 MED ORDER — FENTANYL CITRATE 0.05 MG/ML IJ SOLN
INTRAMUSCULAR | Status: AC
  Filled 2011-06-04: qty 2

## 2011-06-04 MED ORDER — MEMANTINE HCL 10 MG PO TABS
10.0000 mg | ORAL_TABLET | Freq: Two times a day (BID) | ORAL | Status: DC
  Administered 2011-06-04 – 2011-06-05 (×2): 10 mg via ORAL
  Filled 2011-06-04: qty 1

## 2011-06-04 MED ORDER — ASPIRIN EC 81 MG PO TBEC: 81.0000 mg | DELAYED_RELEASE_TABLET | Freq: Every day | ORAL | Status: DC

## 2011-06-04 MED ORDER — MIDAZOLAM HCL 2 MG/2ML IJ SOLN
INTRAMUSCULAR | Status: AC
  Filled 2011-06-04: qty 2

## 2011-06-04 MED ORDER — ONDANSETRON HCL 4 MG/2ML IJ SOLN: 4.0000 mg | Freq: Once | INTRAMUSCULAR | Status: DC | PRN

## 2011-06-04 MED ORDER — MIDAZOLAM HCL 5 MG/5ML IJ SOLN
INTRAMUSCULAR | Status: DC | PRN
  Administered 2011-06-04 (×2): 1 mg via INTRAVENOUS

## 2011-06-04 MED ORDER — SODIUM CHLORIDE 0.9 % IR SOLN
Status: DC | PRN
  Administered 2011-06-04: 1000 mL

## 2011-06-04 MED ORDER — NITROGLYCERIN 0.4 MG SL SUBL: 0.4000 mg | SUBLINGUAL_TABLET | SUBLINGUAL | Status: DC | PRN

## 2011-06-04 MED ORDER — RISPERIDONE 1 MG PO TABS
1.0000 mg | ORAL_TABLET | Freq: Two times a day (BID) | ORAL | Status: DC
  Administered 2011-06-04 – 2011-06-05 (×2): 1 mg via ORAL

## 2011-06-04 MED ORDER — BUPIVACAINE IN DEXTROSE 0.75-8.25 % IT SOLN
INTRATHECAL | Status: AC
  Filled 2011-06-04: qty 2

## 2011-06-04 MED ORDER — MIDAZOLAM HCL 2 MG/2ML IJ SOLN: 1.0000 mg | INTRAMUSCULAR | Status: DC | PRN

## 2011-06-04 MED ORDER — CEFAZOLIN SODIUM 1-5 GM-% IV SOLN
INTRAVENOUS | Status: DC | PRN
  Administered 2011-06-04: 1 g via INTRAVENOUS

## 2011-06-04 MED ORDER — EPHEDRINE SULFATE 50 MG/ML IJ SOLN
INTRAMUSCULAR | Status: AC
  Filled 2011-06-04: qty 1

## 2011-06-04 MED ORDER — FENTANYL CITRATE 0.05 MG/ML IJ SOLN
INTRAMUSCULAR | Status: DC | PRN
  Administered 2011-06-04: 25 ug via INTRATHECAL

## 2011-06-04 MED ORDER — ENOXAPARIN SODIUM 30 MG/0.3ML ~~LOC~~ SOLN
30.0000 mg | SUBCUTANEOUS | Status: DC
  Administered 2011-06-05 – 2011-06-09 (×5): 30 mg via SUBCUTANEOUS
  Filled 2011-06-04 (×5): qty 0.3

## 2011-06-04 MED ORDER — ALBUTEROL SULFATE (5 MG/ML) 0.5% IN NEBU
2.5000 mg | INHALATION_SOLUTION | Freq: Four times a day (QID) | RESPIRATORY_TRACT | Status: DC
  Administered 2011-06-04 – 2011-06-09 (×19): 2.5 mg via RESPIRATORY_TRACT
  Filled 2011-06-04 (×19): qty 0.5

## 2011-06-04 MED ORDER — CALCIUM CARBONATE-VITAMIN D 500-200 MG-UNIT PO TABS
1.0000 | ORAL_TABLET | Freq: Every day | ORAL | Status: DC
  Administered 2011-06-05: 1 via ORAL

## 2011-06-04 MED ORDER — PROMETHAZINE HCL 25 MG/ML IJ SOLN: 12.5000 mg | INTRAMUSCULAR | Status: DC | PRN

## 2011-06-04 MED ORDER — LACTATED RINGERS IV SOLN
INTRAVENOUS | Status: DC
  Administered 2011-06-04: 1000 mL via INTRAVENOUS

## 2011-06-04 MED ORDER — SIMVASTATIN 20 MG PO TABS
20.0000 mg | ORAL_TABLET | Freq: Every day | ORAL | Status: DC
  Administered 2011-06-04: 20 mg via ORAL
  Filled 2011-06-04: qty 1

## 2011-06-04 MED ORDER — PROPOFOL 10 MG/ML IV EMUL
INTRAVENOUS | Status: AC
  Filled 2011-06-04: qty 20

## 2011-06-04 SURGICAL SUPPLY — 54 items
BAG HAMPER (MISCELLANEOUS) ×2 IMPLANT
BLADE SURG SZ10 CARB STEEL (BLADE) ×4 IMPLANT
BLADE SURG SZ20 CARB STEEL (BLADE) ×2 IMPLANT
CLOTH BEACON ORANGE TIMEOUT ST (SAFETY) ×2 IMPLANT
COVER LIGHT HANDLE STERIS (MISCELLANEOUS) ×4 IMPLANT
COVER MAYO STAND XLG (DRAPE) ×2 IMPLANT
DRAPE STERI IOBAN 125X83 (DRAPES) ×2 IMPLANT
DRILL OMEGA BONE (BIT) ×2 IMPLANT
ELECT REM PT RETURN 9FT ADLT (ELECTROSURGICAL) ×2
ELECTRODE REM PT RTRN 9FT ADLT (ELECTROSURGICAL) ×1 IMPLANT
EVACUATOR 3/16  PVC DRAIN (DRAIN) ×1
EVACUATOR 3/16 PVC DRAIN (DRAIN) ×1 IMPLANT
GAUZE XEROFORM 5X9 LF (GAUZE/BANDAGES/DRESSINGS) ×2 IMPLANT
GLOVE BIO SURGEON STRL SZ8 (GLOVE) ×2 IMPLANT
GLOVE BIO SURGEON STRL SZ8.5 (GLOVE) ×2 IMPLANT
GLOVE ECLIPSE 6.5 STRL STRAW (GLOVE) ×2 IMPLANT
GLOVE ECLIPSE 7.0 STRL STRAW (GLOVE) ×1 IMPLANT
GLOVE EXAM NITRILE MD LF STRL (GLOVE) ×1 IMPLANT
GLOVE INDICATOR 7.0 STRL GRN (GLOVE) ×2 IMPLANT
GLOVE INDICATOR 7.5 STRL GRN (GLOVE) ×1 IMPLANT
GOWN STRL REIN XL XLG (GOWN DISPOSABLE) ×7 IMPLANT
GUIDE PIN CALIBRATED (PIN) ×2 IMPLANT
GUIDE PIN CALIBRATED 2.4X23 (PIN) ×1 IMPLANT
INST SET MAJOR BONE (KITS) ×2 IMPLANT
KIT BLADEGUARD II DBL (SET/KITS/TRAYS/PACK) ×2 IMPLANT
KIT ROOM TURNOVER AP CYSTO (KITS) ×2 IMPLANT
MANIFOLD NEPTUNE II (INSTRUMENTS) ×2 IMPLANT
MARKER SKIN DUAL TIP RULER LAB (MISCELLANEOUS) ×2 IMPLANT
NS IRRIG 1000ML POUR BTL (IV SOLUTION) ×2 IMPLANT
PACK BASIC III (CUSTOM PROCEDURE TRAY) ×2
PACK SRG BSC III STRL LF ECLPS (CUSTOM PROCEDURE TRAY) ×1 IMPLANT
PAD ABD 5X9 TENDERSORB (GAUZE/BANDAGES/DRESSINGS) ×2 IMPLANT
PAD ARMBOARD 7.5X6 YLW CONV (MISCELLANEOUS) ×2 IMPLANT
PENCIL HANDSWITCHING (ELECTRODE) ×2 IMPLANT
PLATE SHORT BARRELL 145X5 (Plate) ×1 IMPLANT
SCREW CORTICAL 48MM (Screw) ×1 IMPLANT
SCREW CORTICAL SFTP 4.5X38MM (Screw) ×1 IMPLANT
SCREW CORTICAL SFTP 4.5X40MM (Screw) ×1 IMPLANT
SCREW CORTICAL SFTP 4.5X42MM (Screw) ×1 IMPLANT
SCREW CORTICAL SFTP 4.5X44MM (Screw) ×1 IMPLANT
SCREW LAG 90MM (Screw) ×2 IMPLANT
SCREW LAGSTD 90X21X12.7X9 (Screw) IMPLANT
SET BASIN LINEN APH (SET/KITS/TRAYS/PACK) ×2 IMPLANT
SPONGE GAUZE 4X4 12PLY (GAUZE/BANDAGES/DRESSINGS) ×2 IMPLANT
SPONGE LAP 18X18 X RAY DECT (DISPOSABLE) ×4 IMPLANT
STAPLER VISISTAT 35W (STAPLE) ×2 IMPLANT
STRIP CLOSURE SKIN 1/2X4 (GAUZE/BANDAGES/DRESSINGS) ×1 IMPLANT
SUT BRALON NAB BRD #1 30IN (SUTURE) ×5 IMPLANT
SUT PLAIN 2 0 XLH (SUTURE) ×2 IMPLANT
SUT SILK 0 FSL (SUTURE) ×2 IMPLANT
SYR BULB IRRIGATION 50ML (SYRINGE) ×2 IMPLANT
TAPE MEDIFIX FOAM 3 (GAUZE/BANDAGES/DRESSINGS) ×2 IMPLANT
YANKAUER SUCT 12FT TUBE ARGYLE (SUCTIONS) ×2 IMPLANT
YANKAUER SUCT BULB TIP 10FT TU (MISCELLANEOUS) ×2 IMPLANT

## 2011-06-04 NOTE — Brief Op Note (Signed)
06/02/2011 - 06/04/2011  2:46 PM  PATIENT:  Karen Fowler  77 y.o. female  PRE-OPERATIVE DIAGNOSIS:  Left Hip fracture, Intertrochanteric, displaced   POST-OPERATIVE DIAGNOSIS:  Left Hip fracture  PROCEDURE:  Procedure(s): OPEN REDUCTION INTERNAL FIXATION HIP left intertrochanteric fracture  SURGEON:  Surgeon(s): Delainey Winstanley  PHYSICIAN ASSISTANT:   ASSISTANTS: none   ANESTHESIA:   spinal  EBL:  Total I/O In: 850 [I.V.:850] Out: 650 [Urine:550; Blood:100]  BLOOD ADMINISTERED:none  DRAINS: (1) Hemovact drain(s) in the left hip area with  Suction Open   LOCAL MEDICATIONS USED:  NONE  SPECIMEN:  No Specimen  DISPOSITION OF SPECIMEN:  N/A  COUNTS:  YES  TOURNIQUET:  * No tourniquets in log *  DICTATION: .Other Dictation: Dictation Number [id number]  PLAN OF CARE: Admit to inpatient   PATIENT DISPOSITION:  PACU - hemodynamically stable.   Delay start of Pharmacological VTE agent (>24hrs) due to surgical blood loss or risk of bleeding:  no

## 2011-06-04 NOTE — Anesthesia Procedure Notes (Addendum)
Spinal  Patient location during procedure: OR Start time: 06/04/2011 1:10 PM Staffing Anesthesiologist: Jayme Cloud, LUIS Preanesthetic Checklist Completed: patient identified, site marked, surgical consent, pre-op evaluation, timeout performed, IV checked, risks and benefits discussed and monitors and equipment checked Spinal Block Patient position: left lateral decubitus Prep: Betadine Patient monitoring: heart rate, cardiac monitor, continuous pulse ox and blood pressure Approach: left paramedian Location: L2-3 Injection technique: single-shot Needle Needle type: Spinocan  Needle gauge: 22 G Assessment Sensory level: T8 Additional Notes Multiple attempts;successful with brisk,clear csf.

## 2011-06-04 NOTE — Progress Notes (Signed)
Subjective: She remains confused but has no complaints.  Objective: Vital signs in last 24 hours: Temp:  [97.5 F (36.4 C)-100 F (37.8 C)] 97.5 F (36.4 C) (11/28 0634) Pulse Rate:  [72-93] 85  (11/28 0634) Resp:  [19-24] 22  (11/28 0634) BP: (107-178)/(50-92) 131/80 mmHg (11/28 0634) SpO2:  [95 %-97 %] 97 % (11/28 0634) Weight change:  Last BM Date: 06/02/11  Intake/Output from previous day: 11/27 0701 - 11/28 0700 In: 2275 [I.V.:1900; Blood:375] Out: 1000 [Urine:1000]  PHYSICAL EXAM General appearance: alert and no distress Resp: clear to auscultation bilaterally Cardio: irregularly irregular rhythm GI: soft, non-tender; bowel sounds normal; no masses,  no organomegaly Extremities: Her left leg is in traction  Lab Results:    Basic Metabolic Panel:  Basename 06/04/11 0514 06/03/11 0506  NA 131* 129*  K 4.0 4.2  CL 97 94*  CO2 27 27  GLUCOSE 103* 107*  BUN 13 17  CREATININE 1.27* 1.39*  CALCIUM 9.4 9.2  MG -- --  PHOS -- --   Liver Function Tests:  Basename 06/02/11 1755  AST 25  ALT 22  ALKPHOS 85  BILITOT 0.3  PROT 6.8  ALBUMIN 3.8   No results found for this basename: LIPASE:2,AMYLASE:2 in the last 72 hours No results found for this basename: AMMONIA:2 in the last 72 hours CBC:  Basename 06/04/11 0514 06/03/11 0506 06/02/11 1755  WBC 7.8 7.3 --  NEUTROABS 5.9 -- 6.3  HGB 12.9 9.0* --  HCT 37.8 26.9* --  MCV 90.6 89.7 --  PLT 141* 157 --   Cardiac Enzymes: No results found for this basename: CKTOTAL:3,CKMB:3,CKMBINDEX:3,TROPONINI:3 in the last 72 hours BNP:  [facility] 06/04/11 0514  POCBNP 1284.0*   D-Dimer: No results found for this basename: DDIMER:2 in the last 72 hours CBG: No results found for this basename: GLUCAP:6 in the last 72 hours Hemoglobin A1C: No results found for this basename: HGBA1C in the last 72 hours Fasting Lipid Panel:  Basename 06/03/11 0506  CHOL 135  HDL 71  LDLCALC 51  TRIG 67  CHOLHDL 1.9    LDLDIRECT --   Thyroid Function Tests: No results found for this basename: TSH,T4TOTAL,FREET4,T3FREE,THYROIDAB in the last 72 hours Anemia Panel: No results found for this basename: VITAMINB12,FOLATE,FERRITIN,TIBC,IRON,RETICCTPCT in the last 72 hours Coagulation: No results found for this basename: LABPROT:2,INR:2 in the last 72 hours Urine Drug Screen: Drugs of Abuse     Component Value Date/Time   LABOPIA NONE DETECTED 01/13/2011 1640   COCAINSCRNUR NONE DETECTED 01/13/2011 1640   LABBENZ NONE DETECTED 01/13/2011 1640   AMPHETMU NONE DETECTED 01/13/2011 1640   THCU NONE DETECTED 01/13/2011 1640   LABBARB NONE DETECTED 01/13/2011 1640    Alcohol Level: No results found for this basename: ETH:2 in the last 72 hours Urinalysis:  Misc. Labs:  ABGS No results found for this basename: PHART,PCO2,PO2ART,TCO2,HCO3 in the last 72 hours CULTURES No results found for this or any previous visit (from the past 240 hour(s)). Studies/Results: Dg Chest 1 View  06/02/2011  *RADIOLOGY REPORT*  Clinical Data: Left hip fracture.  Pain after falling.  CHEST - 1 VIEW  Comparison: 01/13/2011  Findings: The patient has had a median sternotomy and CABG.  Heart is mildly enlarged.  The patient is rotated towards the left. There are no focal consolidations or pleural effusions.  There is deformity of the right fifth posterior lateral rib consistent with fracture.  The appearance favors old fracture and correlation is recommended with history of chest injury today.  No evidence for pneumothorax.  Degenerative changes are seen in the spine.  IMPRESSION:  1.  Postoperative changes. 2.  Cardiomegaly without pulmonary edema. 3.  Probable old right rib fracture.  See above.  Original Report Authenticated By: Patterson Hammersmith, M.D.   Dg Pelvis 1-2 Views  06/02/2011  *RADIOLOGY REPORT*  Clinical Data: Trauma.  Left hip pain after falling.  PELVIS - 1-2 VIEW  Comparison: 05/23/2011  Findings: There is a comminuted  intertrochanteric fracture of the left hip, associated with varus angulation.  The femoral head appears located within the acetabulum on this frontal view.  Bones appear osteopenic.  IMPRESSION: Comminuted intertrochanteric fracture of the left hip.  Original Report Authenticated By: Patterson Hammersmith, M.D.   Dg Femur Left  06/02/2011  *RADIOLOGY REPORT*  Clinical Data: Left hip pain after falling today.  LEFT FEMUR - 2 VIEW  Comparison: Pelvis 06/02/2011  Findings: There is a comminuted intertrochanteric fracture of the left hip.  There is associated varus angulation.  The femoral head is located within the acetabulum based on the lateral view. Degenerative changes are seen in the knee.  There is atherosclerotic calcification of the superficial femoral artery. No other fractures are identified.  IMPRESSION: Comminuted intertrochanteric fracture of the left hip.  Original Report Authenticated By: Patterson Hammersmith, M.D.    Medications:  Scheduled:   . amLODipine  5 mg Oral Daily  . antiseptic oral rinse  15 mL Mouth Rinse BID  . aspirin  81 mg Oral Daily  . calcium-vitamin D  1 tablet Oral Daily  . ceFAZolin (ANCEF) IV  1 g Intravenous Once  . memantine  10 mg Oral BID  . metoprolol tartrate  25 mg Oral Daily  . povidone-iodine   Topical Once  . povidone-iodine      . risperiDONE  1 mg Oral BID  . simvastatin  20 mg Oral q1800  . sodium chloride      . sodium chloride      . vitamin B-12  1,000 mcg Oral Daily   Continuous:   . sodium chloride 75 mL/hr at 06/03/11 1626   ZOX:[id number], morphine injection  Assesment: She has a fractured hip. She has atrial fibrillation and coronary artery occlusive disease Active Problems:  CAD, ARTERY BYPASS GRAFT  CAROTID ARTERY DISEASE    Plan: She is set for surgery. She is cleared medically although she is at higher than average risk    LOS: 2 days   Srihari Shellhammer L 06/04/2011, 8:35 AM

## 2011-06-04 NOTE — Transfer of Care (Signed)
Immediate Anesthesia Transfer of Care Note  Patient: Karen Fowler  Procedure(s) Performed:  OPEN REDUCTION INTERNAL FIXATION HIP  Patient Location: PACU  Anesthesia Type: SAB  Level of Consciousness: awake  Airway & Oxygen Therapy: Patient Spontanous Breathing and non-rebreather face mask  Post-op Assessment: Report given to PACU RN, Post -op Vital signs reviewed and stable. SAB Level  T 12  Post vital signs: Reviewed and stable  Complications: No apparent anesthesia complications

## 2011-06-04 NOTE — Anesthesia Preprocedure Evaluation (Addendum)
Anesthesia Evaluation  Patient identified by MRN, date of birth, ID band Patient confused    Reviewed: Allergy & Precautions, H&P , NPO status , Patient's Chart, lab work & pertinent test results  History of Anesthesia Complications Negative for: history of anesthetic complications  Airway Mallampati: II      Dental  (+) Missing and Poor Dentition,    Pulmonary neg pulmonary ROS,    Pulmonary exam normal       Cardiovascular hypertension, Pt. on medications + CAD + Valvular Problems/Murmurs AI and MR Irregular Normal    Neuro/Psych PSYCHIATRIC DISORDERS (dementia, hx of phycosis)    GI/Hepatic   Endo/Other    Renal/GU      Musculoskeletal   Abdominal   Peds  Hematology   Anesthesia Other Findings   Reproductive/Obstetrics                          Anesthesia Physical Anesthesia Plan  ASA: III  Anesthesia Plan: Spinal   Post-op Pain Management:    Induction:   Airway Management Planned: Nasal Cannula  Additional Equipment:   Intra-op Plan:   Post-operative Plan:   Informed Consent: I have reviewed the patients History and Physical, chart, labs and discussed the procedure including the risks, benefits and alternatives for the proposed anesthesia with the patient or authorized representative who has indicated his/her understanding and acceptance.     Plan Discussed with:   Anesthesia Plan Comments:         Anesthesia Quick Evaluation

## 2011-06-04 NOTE — Anesthesia Postprocedure Evaluation (Signed)
Anesthesia Post Note  Patient: Karen Fowler  Procedure(s) Performed:  OPEN REDUCTION INTERNAL FIXATION HIP  Anesthesia type: Spinal  Patient location: PACU  Post pain: Pain level controlled  Post assessment: Post-op Vital signs reviewed, Patient's Cardiovascular Status Stable, Respiratory Function Stable, Patent Airway, No signs of Nausea or vomiting and Pain level controlled  Last Vitals:  Filed Vitals:   06/04/11 1501  BP: 120/45  Pulse: 64  Temp: 36.4 C  Resp: 14    Post vital signs: Reviewed and stable  Level of consciousness: awake and alert   Complications: No apparent anesthesia complications

## 2011-06-05 LAB — DIFFERENTIAL
Basophils Absolute: 0 10*3/uL (ref 0.0–0.1)
Basophils Relative: 0 % (ref 0–1)
Eosinophils Relative: 0 % (ref 0–5)
Lymphocytes Relative: 9 % — ABNORMAL LOW (ref 12–46)
Monocytes Absolute: 1 10*3/uL (ref 0.1–1.0)
Neutro Abs: 7.8 10*3/uL — ABNORMAL HIGH (ref 1.7–7.7)

## 2011-06-05 LAB — BASIC METABOLIC PANEL
BUN: 19 mg/dL (ref 6–23)
Calcium: 9.2 mg/dL (ref 8.4–10.5)
Creatinine, Ser: 1.27 mg/dL — ABNORMAL HIGH (ref 0.50–1.10)
GFR calc Af Amer: 44 mL/min — ABNORMAL LOW (ref >90)

## 2011-06-05 LAB — CBC
MCHC: 34.3 g/dL (ref 30.0–36.0)
Platelets: 145 10*3/uL — ABNORMAL LOW (ref 150–400)
RDW: 14.3 % (ref 11.5–15.5)
WBC: 9.6 10*3/uL (ref 4.0–10.5)

## 2011-06-05 MED ORDER — SODIUM CHLORIDE 0.9 % IJ SOLN
INTRAMUSCULAR | Status: AC
  Administered 2011-06-05: 10 mL
  Filled 2011-06-05: qty 3

## 2011-06-05 MED ORDER — HYDROGEN PEROXIDE 3 % EX SOLN
CUTANEOUS | Status: DC | PRN
  Administered 2011-06-05: 1

## 2011-06-05 NOTE — Progress Notes (Addendum)
SUBJECTIVE: Awake, responds appropriately and follows commands.  Mild confusion.  Principal Problem:  *Hip fracture, intertrochanteric Active Problems:  HYPERLIPIDEMIA-MIXED  HYPERTENSION, UNSPECIFIED  CAD, ARTERY BYPASS GRAFT  CAROTID ARTERY DISEASE  Dementia   LABS: Basic Metabolic Panel:  Basename 06/05/11 0504 06/04/11 0514  NA 129* 131*  K 4.1 4.0  CL 96 97  CO2 24 27  GLUCOSE 165* 103*  BUN 19 13  CREATININE 1.27* 1.27*  CALCIUM 9.2 9.4  MG -- --  PHOS -- --   Liver Function Tests:  [facility] 06/02/11 1755  AST 25  ALT 22  ALKPHOS 85  BILITOT 0.3  PROT 6.8  ALBUMIN 3.8   CBC:  Basename 06/05/11 0504 06/04/11 0514  WBC 9.6 7.8  NEUTROABS 7.8* 5.9  HGB 10.9* 12.9  HCT 31.8* 37.8  MCV 90.6 90.6  PLT 145* 141*   Cardiac Enzymes: No results found for this basename: CKTOTAL:3,CKMB:3,CKMBINDEX:3,TROPONINI:3 in the last 72 hours BNP:  [facility] 06/04/11 0514  POCBNP 1284.0*   Fasting Lipid Panel:  Basename 06/03/11 0506  CHOL 135  HDL 71  LDLCALC 51  TRIG 67  CHOLHDL 1.9  LDLDIRECT --   RADIOLOGY: Dg Hip Operative Left  06/04/2011  *RADIOLOGY REPORT*  Clinical Data: Left hip fracture.  OPERATIVE LEFT HIP  Comparison: 06/02/2011  Findings: Three intraoperative spot images demonstrates internal fixation of the left intertrochanteric fracture with lateral plate and dynamic hip screw.  Near anatomic alignment.  IMPRESSION: Internal fixation of the left intertrochanteric fracture. No complicating feature.  Original Report Authenticated By: Cyndie Chime, M.D.     PHYSICAL EXAM BP 130/77  Pulse 128  Temp(Src) 99.6 F (37.6 C) (Oral)  Resp 16  Ht 5\' 3"  (1.6 m)  Wt 147 lb 7.8 oz (66.9 kg)  BMI 26.13 kg/m2  SpO2 94% General: Well developed, well nourished, in no acute distress Head: Eyes PERRLA, No xanthomas.   Normal cephalic and atramatic  Lungs: Clear bilaterally to auscultation and percussion. Heart: HRRR S1 S2, No MRG .  Pulses are 2+ &  equal.            No carotid bruit. No JVD.  No abdominal bruits. No femoral bruits. Abdomen: Bowel sounds are positive, abdomen soft and non-tender without masses or                  Hernia's noted. Msk:  Back normal, diminished gait. Poor strength and tone for age. Extremities:  Incision to lateral left leg with JP drain noted.  Neuro: Alert and oriented X 3. Psych:  Good affect, responds appropriately  TELEMETRY: Reviewed telemetry pt in: ST with PAC's 90's.  ASSESSMENT AND PLAN:  1. CAD: She is stable from our standpoint. Tachycardia is related to acute state post-op and most likely pain.  Will not adjust medications at this time.  Blood pressure is well controlled. Will sign off but will be available for any questions.   Attending note:  Patient seen and examined. Discussed with Ms. Lawrence, whose findings are outlined above. Patient is status post ORIF of the left hip on 11/28. She is confused this morning, however awake and responds to questions. ECG from yesterday showed sinus rhythm with frequent PACs. Heart rate has increased postoperatively, also in the setting of pain. Current medications are reviewed, including Lopressor 25 mg once a day.  Recommend dosing Lopressor at twice a day, given that this is a short acting medication. Check followup ECG for tomorrow as well. Follow telemetry.  Illene Bolus  Diona Browner, M.D., F.A.C.C.

## 2011-06-05 NOTE — Progress Notes (Signed)
Physical Therapy Evaluation Patient Details Name: Karen Fowler MRN: [id number] DOB: 08-18-1925 Today's Date: 06/05/2011  Problem List:  Patient Active Problem List  Diagnoses  . HYPERLIPIDEMIA-MIXED  . MITRAL REGURGITATION, 0 (MILD)  . HYPERTENSION, UNSPECIFIED  . Coronary atherosclerosis of native coronary artery  . CAROTID ARTERY DISEASE  . FATIGUE  . Dementia  . Hip fracture, intertrochanteric    Past Medical History:  Past Medical History  Diagnosis Date  . Mitral valve insufficiency and aortic valve insufficiency   . Hypertension   . Hyperlipidemia   . Coronary atherosclerosis of native coronary artery 2006    CABG in 2006  . Anemia, unspecified   . Dementia   . Closed intertrochanteric fracture of left hip    Past Surgical History:  Past Surgical History  Procedure Date  . Coronary artery bypass graft 2006    Dr. Barry Dienes  . Abdominal hysterectomy     PT Assessment/Plan/Recommendation PT Assessment Clinical Impression Statement: pt with dementia, but cooperative...no prior functional status is known...unable to begin any exercise pe protocol due to pt's fear of moving either leg...max assist to transfer bed to chair, but comfrtable once in chair...will need SNF at d/c PT Recommendation/Assessment: Patient will need skilled PT in the acute care venue PT Problem List: Decreased strength;Decreased range of motion;Decreased activity tolerance;Decreased knowledge of use of DME;Decreased cognition;Decreased mobility;Decreased safety awareness;Decreased knowledge of precautions;Pain Barriers to Discharge: None PT Therapy Diagnosis : Difficulty walking;Generalized weakness;Acute pain PT Plan PT Frequency: Min 5X/week PT Treatment/Interventions: DME instruction;Gait training;Functional mobility training;Therapeutic exercise;Patient/family education PT Recommendation Follow Up Recommendations: Skilled nursing facility Equipment Recommended: Defer to next venue PT  Goals  Acute Rehab PT Goals PT Goal Formulation: Patient unable to participate in goal setting Time For Goal Achievement: 2 weeks Pt will go Supine/Side to Sit: with max assist Pt will go Sit to Supine/Side: with max assist Pt will Transfer Sit to Stand/Stand to Sit: with mod assist Pt will Transfer Bed to Chair/Chair to Bed: with max assist Pt will Ambulate: 1 - 15 feet;with max assist;with rolling walker  PT Evaluation Precautions/Restrictions  Precautions Precautions: Fall Required Braces or Orthoses: No Restrictions Weight Bearing Restrictions: Yes LLE Weight Bearing: Touchdown weight bearing Prior Functioning  Home Living Receives Help From: Personal care attendant Type of Home: Assisted living Home Layout: One level Home Access: Level entry Prior Function Level of Independence: Other (comment) (no info available) Cognition Cognition Arousal/Alertness: Lethargic Overall Cognitive Status: History of cognitive impairments History of Cognitive Impairment: Appears at baseline functioning Orientation Level: Oriented to person;Disoriented to place;Disoriented to time;Disoriented to situation Sensation/Coordination Sensation Light Touch: Appears Intact Stereognosis: Not tested Hot/Cold: Not tested Proprioception: Not tested Coordination Gross Motor Movements are Fluid and Coordinated: Yes Fine Motor Movements are Fluid and Coordinated: Not tested Extremity Assessment RUE Assessment RUE Assessment: Within Functional Limits LUE Assessment LUE Assessment: Within Functional Limits RLE Assessment RLE Assessment: Within Functional Limits LLE Assessment LLE Assessment: Not tested Mobility (including Balance) Bed Mobility Bed Mobility: Yes Supine to Sit: 1: +1 Total assist Supine to Sit Details (indicate cue type and reason): pt very fearful of moving Sitting - Scoot to Edge of Bed: 1: +1 Total assist Transfers Transfers: Yes Stand Pivot Transfers: 1: +1 Total  assist Stand Pivot Transfer Details (indicate cue type and reason): pt able to bear some weight on RLE Ambulation/Gait Ambulation/Gait: No Stairs: No Wheelchair Mobility Wheelchair Mobility: No  Posture/Postural Control Posture/Postural Control: Postural limitations Postural Limitations: thoracic kyphosis Balance Balance Assessed: No Exercise  End of Session PT - End of Session Equipment Utilized During Treatment: Gait belt Activity Tolerance: Patient tolerated treatment well Patient left: in chair;with call bell in reach;with bed alarm set General Behavior During Session: [facility] for tasks performed Cognition: [facility] for tasks performed  Konrad Penta 06/05/2011, 11:13 AM

## 2011-06-05 NOTE — Progress Notes (Signed)
Physical Therapy Treatment Patient Details Name: Karen Fowler MRN: [id number] DOB: 02-Jan-1926 Today's Date: 06/05/2011  PT Assessment/Plan  PT - Assessment/Plan Comments on Treatment Session: pt tolerated being up in recliner for several hours today, but very fatigued PT Plan: Discharge plan remains appropriate PT Goals  Acute Rehab PT Goals PT Goal: Sit to Supine/Side - Progress: Progressing toward goal PT Transfer Goal: Bed to Chair/Chair to Bed - Progress: Progressing toward goal  PT Treatment Precautions/Restrictions  Precautions Precautions: Fall Required Braces or Orthoses: No Restrictions Weight Bearing Restrictions: Yes LLE Weight Bearing: Touchdown weight bearing Mobility (including Balance) Bed Mobility Sit to Supine - Right: 1: +2 Total assist Transfers Stand Pivot Transfers: 1: +1 Total assist Stand Pivot Transfer Details (indicate cue type and reason): pt very fatigued, unable to assist Ambulation/Gait Ambulation/Gait: No Stairs: No Wheelchair Mobility Wheelchair Mobility: No    Exercise    End of Session PT - End of Session Equipment Utilized During Treatment: Gait belt Activity Tolerance: Patient limited by fatigue Patient left: in bed;with call bell in reach (nursing staff with pt) Nurse Communication: Mobility status for transfers General Behavior During Session: [name] for tasks performed Cognition: Impaired, at baseline  Karen Fowler 06/05/2011, 3:48 PM

## 2011-06-05 NOTE — Progress Notes (Signed)
Subjective: She is about the same. She did have her surgery yesterday and it went well. She has no new complaints but is complaining of pain in her hip but it's only fairly mild radiated a 2 or so. She has no other new problems noted. She is still confused.  Objective: Vital signs in last 24 hours: Temp:  [97.4 F (36.3 C)-99.6 F (37.6 C)] 99.6 F (37.6 C) (11/29 0700) Pulse Rate:  [54-128] 128  (11/29 0700) Resp:  [11-20] 16  (11/29 0700) BP: (96-156)/(45-77) 130/77 mmHg (11/29 0700) SpO2:  [93 %-100 %] 94 % (11/29 0747) Weight change:  Last BM Date: 06/02/11  Intake/Output from previous day: 11/28 0701 - 11/29 0700 In: 1040 [I.V.:1040] Out: 940 [Urine:725; Drains:115; Blood:100]  PHYSICAL EXAM General appearance: alert and no distress Resp: clear to auscultation bilaterally Cardio: irregularly irregular rhythm GI: soft, non-tender; bowel sounds normal; no masses,  no organomegaly Extremities: extremities normal, atraumatic, no cyanosis or edema  Lab Results:    Basic Metabolic Panel:  Basename 06/05/11 0504 06/04/11 0514  NA 129* 131*  K 4.1 4.0  CL 96 97  CO2 24 27  GLUCOSE 165* 103*  BUN 19 13  CREATININE 1.27* 1.27*  CALCIUM 9.2 9.4  MG -- --  PHOS -- --   Liver Function Tests:  Basename 06/02/11 1755  AST 25  ALT 22  ALKPHOS 85  BILITOT 0.3  PROT 6.8  ALBUMIN 3.8   No results found for this basename: LIPASE:2,AMYLASE:2 in the last 72 hours No results found for this basename: AMMONIA:2 in the last 72 hours CBC:  Basename 06/05/11 0504 06/04/11 0514  WBC 9.6 7.8  NEUTROABS 7.8* 5.9  HGB 10.9* 12.9  HCT 31.8* 37.8  MCV 90.6 90.6  PLT 145* 141*   Cardiac Enzymes: No results found for this basename: CKTOTAL:3,CKMB:3,CKMBINDEX:3,TROPONINI:3 in the last 72 hours BNP:  [facility] 06/04/11 0514  POCBNP 1284.0*   D-Dimer: No results found for this basename: DDIMER:2 in the last 72 hours CBG: No results found for this basename: GLUCAP:6 in the  last 72 hours Hemoglobin A1C: No results found for this basename: HGBA1C in the last 72 hours Fasting Lipid Panel:  Basename 06/03/11 0506  CHOL 135  HDL 71  LDLCALC 51  TRIG 67  CHOLHDL 1.9  LDLDIRECT --   Thyroid Function Tests: No results found for this basename: TSH,T4TOTAL,FREET4,T3FREE,THYROIDAB in the last 72 hours Anemia Panel: No results found for this basename: VITAMINB12,FOLATE,FERRITIN,TIBC,IRON,RETICCTPCT in the last 72 hours Coagulation: No results found for this basename: LABPROT:2,INR:2 in the last 72 hours Urine Drug Screen: Drugs of Abuse     Component Value Date/Time   LABOPIA NONE DETECTED 01/13/2011 1640   COCAINSCRNUR NONE DETECTED 01/13/2011 1640   LABBENZ NONE DETECTED 01/13/2011 1640   AMPHETMU NONE DETECTED 01/13/2011 1640   THCU NONE DETECTED 01/13/2011 1640   LABBARB NONE DETECTED 01/13/2011 1640    Alcohol Level: No results found for this basename: ETH:2 in the last 72 hours Urinalysis:  Misc. Labs:  ABGS No results found for this basename: PHART,PCO2,PO2ART,TCO2,HCO3 in the last 72 hours CULTURES Recent Results (from the past 240 hour(s))  SURGICAL PCR SCREEN     Status: Normal   Collection Time   06/04/11  7:45 AM      Component Value Range Status Comment   MRSA, PCR NEGATIVE  NEGATIVE  Final    Staphylococcus aureus NEGATIVE  NEGATIVE  Final    Studies/Results: Dg Hip Operative Left  06/04/2011  *RADIOLOGY REPORT*  Clinical Data: Left hip fracture.  OPERATIVE LEFT HIP  Comparison: 06/02/2011  Findings: Three intraoperative spot images demonstrates internal fixation of the left intertrochanteric fracture with lateral plate and dynamic hip screw.  Near anatomic alignment.  IMPRESSION: Internal fixation of the left intertrochanteric fracture. No complicating feature.  Original Report Authenticated By: Cyndie Chime, M.D.    Medications:  Scheduled:   . albuterol  2.5 mg Nebulization QID  . amLODipine  5 mg Oral Daily  . amLODipine  5 mg Oral  Daily  . antiseptic oral rinse  15 mL Mouth Rinse BID  . aspirin  81 mg Oral Daily  . aspirin EC  81 mg Oral Daily  . bupivacaine 0.75% in dextrose 8.25% (intrathecal)      . calcium-vitamin D  1 tablet Oral Daily  . calcium-vitamin D  1 tablet Oral Daily  . ceFAZolin (ANCEF) IV  1 g Intravenous Once  . enoxaparin (LOVENOX) injection  30 mg Subcutaneous Q24H  . ePHEDrine      . fentaNYL      . memantine  10 mg Oral BID  . memantine  10 mg Oral BID  . metoprolol tartrate  25 mg Oral Daily  . metoprolol tartrate  25 mg Oral Q0700  . midazolam      . pneumococcal 23 valent vaccine  0.5 mL Intramuscular Tomorrow-1000  . propofol      . risperiDONE  1 mg Oral BID  . risperiDONE  1 mg Oral BID  . simvastatin  20 mg Oral q1800  . simvastatin  20 mg Oral QHS  . sulfamethoxazole-trimethoprim  1 tablet Oral Q12H  . vitamin B-12  1,000 mcg Oral Daily  . vitamin B-12  1,000 mcg Oral Q0700   Continuous:   . sodium chloride 75 mL/hr at 06/04/11 2300  . DISCONTD: lactated ringers 1,000 mL (06/04/11 1217)   WJX:BJYNWGNFAOZHY, acetaminophen, magnesium hydroxide, morphine injection, nitroGLYCERIN, promethazine, DISCONTD: fentaNYL, DISCONTD: midazolam, DISCONTD: ondansetron (ZOFRAN) IV, DISCONTD: sodium chloride irrigation  Assesment: She had a hip fracture and seems to be doing okay. She has dementia which is unchanged. She has coronary artery occlusive disease but no symptoms from that. She is in atrial fibrillation but is not a candidate for anti-coagulation Principal Problem:  *Hip fracture, intertrochanteric Active Problems:  HYPERLIPIDEMIA-MIXED  HYPERTENSION, UNSPECIFIED  CAD, ARTERY BYPASS GRAFT  CAROTID ARTERY DISEASE  Dementia    Plan: No change in treatments she is to start physical therapy today.    LOS: 3 days   Karen Fowler 06/05/2011, 8:52 AM

## 2011-06-05 NOTE — Progress Notes (Signed)
Subjective: 1 Day Post-Op Procedure(s) (LRB): OPEN REDUCTION INTERNAL FIXATION HIP (Left) Patient reports pain as 2 on 0-10 scale and 3 on 0-10 scale.    Objective: Vital signs in last 24 hours: Temp:  [97.4 F (36.3 C)-99.6 F (37.6 C)] 99.6 F (37.6 C) (11/29 0700) Pulse Rate:  [54-128] 128  (11/29 0700) Resp:  [11-20] 16  (11/29 0700) BP: (96-156)/(45-77) 130/77 mmHg (11/29 0700) SpO2:  [93 %-100 %] 94 % (11/29 0747)  Intake/Output from previous day: 11/28 0701 - 11/29 0700 In: 1040 [I.V.:1040] Out: 940 [Urine:725; Drains:115; Blood:100] Intake/Output this shift:     Basename 06/05/11 0504 06/04/11 0514 06/03/11 0506 06/02/11 1755  HGB 10.9* 12.9 9.0* 10.9*    Basename 06/05/11 0504 06/04/11 0514  WBC 9.6 7.8  RBC 3.51* 4.17  HCT 31.8* 37.8  PLT 145* 141*    Basename 06/05/11 0504 06/04/11 0514  NA 129* 131*  K 4.1 4.0  CL 96 97  CO2 24 27  BUN 19 13  CREATININE 1.27* 1.27*  GLUCOSE 165* 103*  CALCIUM 9.2 9.4   No results found for this basename: LABPT:2,INR:2 in the last 72 hours  Neurovascular intact Sensation intact distally Intact pulses distally She is chronically confused but not complaining of much pain.  Assessment/Plan: 1 Day Post-Op Procedure(s) (LRB): OPEN REDUCTION INTERNAL FIXATION HIP (Left) Up with therapy  Trista Ciocca 06/05/2011, 8:36 AM

## 2011-06-05 NOTE — Op Note (Signed)
Karen Fowler, BENASSI NO.:  [id number]  MEDICAL RECORD NO.:  [id number]  LOCATION:  A311                          FACILITY:  [facility]  PHYSICIAN:  J. Darreld Mclean, M.D. DATE OF BIRTH:  [birth year]  DATE OF PROCEDURE: DATE OF DISCHARGE:                              OPERATIVE REPORT   PREOPERATIVE DIAGNOSIS:  Intertrochanteric fracture, left hip displaced.  POSTOPERATIVE DIAGNOSIS:  Intertrochanteric fracture, left hip displaced.  PROCEDURE:  Open reduction and internal fixation of left hip intertrochanteric fracture using a [employer] hip compression system with 145-degree angle 5-hole side plate.  Screws measuring from 44-36 mm and a 90-mm compression screw.  DRAINS:  One Hemovac drain.  ANESTHESIA:  Spinal.  SURGEON:  J. Darreld Mclean, MD  ESTIMATED BLOOD LOSS:  100 to 150 mL.  INDICATIONS:  The patient is an 77 year old female who fell at a local rest home 2 days ago.  She has a history of heart disease.  She also has a history of severe dementia.  Surgery delayed until I got medical clearance.  Dr. Juanetta Gosling has given clearance, although she is at high risk secondary to her underlying medical problems.  She is felt to be an acceptable risk.  The patient was seen in the holding area and the left hip identified as correct surgical site and a mark was placed on the left hip.  She was brought back to the OR and given spinal anesthesia and transferred to the fracture table.  AP and lateral views were deemed to be satisfactory after reduction of the hip on the fracture table.  She was prepped and draped in usual manner.  It should be noted prior to using x-ray, we had a time-out for Radiology and one had gowns on her, one on the thyroid shields, and other badges.  Machine was working properly.  So we had a time-out before proceeding with the incision.  The patient was identified once again.  Left hip was identified as the correct surgical site.  All  instrumentations were properly positioned and working.  The OR team knew each other.  An incision made through skin, subcutaneous tissue, tensor fascia lata, vastus lateralis.  Guide pin was placed on the hip and looked good in the AP and lateral views, measured 90 mm.  A 90 mm step drill was used and then a 90 mm compression screw was inserted with 145-degree angled 5- hole side plate.  Screws were placed under compression, proximal screw first and then going down to the distal screw holes.  Screw lengths measure 44 to 38 mm.  X-rays were taken showing good reduction of the fracture and position and alignment of the screws were good.  Had a Hemovac placed and sewn in with 2-0 silk.  The vastus lateralis reapproximated using running locking #1 Surgilon suture.  The tensor fascia lata was approximated with interrupted figure-of-eight #1 Surgilon suture.  Tensor fascia lata was closed completely. Subcutaneous tissue closed using 2-0 plain and then skin staples for the skin.  The process of removing the Betadine-impregnated dressing over the skin and a small little nick in the skin was more of an abrasion just superior to the wound between  the Hemovac and the superior edge of the wound.  Steri-Strips will be applied to this.  There were no other complications during the procedure.  It was not deep enough to require staple or suture.  The patient got to recovery in good condition.          ______________________________ Karen Fowler. Darreld Mclean, M.D.     JWK/MEDQ  D:  06/04/2011  T:  06/04/2011  Job:  [id number]  cc:   Ramon Dredge L. Juanetta Gosling, M.D. Fax: [phone]

## 2011-06-05 NOTE — Progress Notes (Signed)
CARE MANAGEMENT NOTE 06/05/2011  Patient:  Karen Fowler, Karen Fowler   Account Number:  [id number]  Date Initiated:  06/05/2011  Documentation initiated by:  Rosemary Holms  Subjective/Objective Assessment:   Pt admited with hip fracture     Action/Plan:   pta, pt lived at [facility], anticipate return to [facility]   Anticipated DC Date:  06/11/2011   Anticipated DC Plan:  SKILLED NURSING FACILITY      DC Planning Services  CM consult      Choice offered to / List presented to:             Status of service:  In process, will continue to follow Medicare Important Message given?  YES (If response is "NO", the following Medicare IM given date fields will be blank) Date Medicare IM given:  06/05/2011 Date Additional Medicare IM given:    Discharge Disposition:    Per UR Regulation:    Comments:  06/05/11 1630 Adyen Bifulco Leanord Hawking RN BSN

## 2011-06-05 NOTE — Progress Notes (Signed)
06/05/11 1230 Pneumococcal vaccine administered per protocol to left deltoid this afternoon. See MAR for documentation. Tolerated well. Will monitor site.

## 2011-06-05 NOTE — Addendum Note (Signed)
Addendum  created 06/05/11 1109 by Minerva Areola, CRNA   Modules edited:Notes Section

## 2011-06-05 NOTE — Anesthesia Postprocedure Evaluation (Signed)
Anesthesia Post Note  Patient: Karen Fowler  Procedure(s) Performed:  OPEN REDUCTION INTERNAL FIXATION HIP  Anesthesia type: Spinal  Patient location: 311  Post pain: Pain level controlled  Post assessment: Post-op Vital signs reviewed, Patient's Cardiovascular Status Stable, Respiratory Function Stable, Patent Airway, No signs of Nausea or vomiting and Pain level controlled  Last Vitals:  Filed Vitals:   06/05/11 0700  BP: 130/77  Pulse: 128  Temp: 37.6 C  Resp: 16    Post vital signs: Reviewed Heart rate now 100 at 1100 on 05/26/2011,post PT. Pt. Sitting in chair  Level of consciousness: awake and alert   Complications: No apparent anesthesia complications

## 2011-06-05 NOTE — Progress Notes (Signed)
CSW presented bed offers to [facility] at [facility] who is agreeable to transfer to [facility] when stable.  Facility notified.  CSW to continue to follow.  Karen Fowler

## 2011-06-06 DIAGNOSIS — I4891 Unspecified atrial fibrillation: Secondary | ICD-10-CM | POA: Diagnosis present

## 2011-06-06 LAB — DIFFERENTIAL
Basophils Absolute: 0 10*3/uL (ref 0.0–0.1)
Eosinophils Relative: 1 % (ref 0–5)
Lymphocytes Relative: 6 % — ABNORMAL LOW (ref 12–46)
Lymphs Abs: 0.6 10*3/uL — ABNORMAL LOW (ref 0.7–4.0)
Neutro Abs: 7.4 10*3/uL (ref 1.7–7.7)
Neutrophils Relative %: 83 % — ABNORMAL HIGH (ref 43–77)

## 2011-06-06 LAB — BASIC METABOLIC PANEL
CO2: 24 mEq/L (ref 19–32)
Calcium: 8.6 mg/dL (ref 8.4–10.5)
Creatinine, Ser: 1.19 mg/dL — ABNORMAL HIGH (ref 0.50–1.10)
GFR calc Af Amer: 48 mL/min — ABNORMAL LOW (ref >90)
Sodium: 129 mEq/L — ABNORMAL LOW (ref 135–145)

## 2011-06-06 LAB — PREPARE RBC (CROSSMATCH)

## 2011-06-06 LAB — CBC
MCV: 91.1 fL (ref 78.0–100.0)
Platelets: 124 10*3/uL — ABNORMAL LOW (ref 150–400)
RBC: 2.81 MIL/uL — ABNORMAL LOW (ref 3.87–5.11)
RDW: 14.5 % (ref 11.5–15.5)
WBC: 8.9 10*3/uL (ref 4.0–10.5)

## 2011-06-06 LAB — TYPE AND SCREEN
ABO/RH(D): A POS
Antibody Screen: NEGATIVE
Unit division: 0

## 2011-06-06 MED ORDER — SODIUM CHLORIDE 0.9 % IJ SOLN
INTRAMUSCULAR | Status: AC
  Administered 2011-06-06: 10 mL
  Filled 2011-06-06: qty 3

## 2011-06-06 NOTE — Progress Notes (Signed)
Subjective: She is overall about the same. She is alert but confused. She has no complaints. She rates her pain to me at 0 her breathing is doing okay. She denies any chest pain  Objective: Vital signs in last 24 hours: Temp:  [97.4 F (36.3 C)-98.9 F (37.2 C)] 97.4 F (36.3 C) (11/30 0515) Pulse Rate:  [106-110] 110  (11/30 0515) Resp:  [18-20] 20  (11/30 0515) BP: (107-142)/(61-70) 142/70 mmHg (11/30 0515) SpO2:  [92 %-96 %] 93 % (11/30 0515) Weight change:  Last BM Date: 06/02/11  Intake/Output from previous day: 11/29 0701 - 11/30 0700 In: 1573.8 [I.V.:1573.8] Out: 620 [Urine:600; Drains:20]  PHYSICAL EXAM General appearance: alert, cooperative and no distress Resp: clear to auscultation bilaterally Cardio: regular rate and rhythm, S1, S2 normal, no murmur, click, rub or gallop GI: soft, non-tender; bowel sounds normal; no masses,  no organomegaly Extremities: extremities normal, atraumatic, no cyanosis or edema  Lab Results:    Basic Metabolic Panel:  Basename 06/06/11 0536 06/05/11 0504  NA 129* 129*  K 4.2 4.1  CL 98 96  CO2 24 24  GLUCOSE 127* 165*  BUN 19 19  CREATININE 1.19* 1.27*  CALCIUM 8.6 9.2  MG -- --  PHOS -- --   Liver Function Tests: No results found for this basename: AST:2,ALT:2,ALKPHOS:2,BILITOT:2,PROT:2,ALBUMIN:2 in the last 72 hours No results found for this basename: LIPASE:2,AMYLASE:2 in the last 72 hours No results found for this basename: AMMONIA:2 in the last 72 hours CBC:  Basename 06/06/11 0536 06/05/11 0504  WBC 8.9 9.6  NEUTROABS 7.4 7.8*  HGB 8.9* 10.9*  HCT 25.6* 31.8*  MCV 91.1 90.6  PLT 124* 145*   Cardiac Enzymes: No results found for this basename: CKTOTAL:3,CKMB:3,CKMBINDEX:3,TROPONINI:3 in the last 72 hours BNP:  Basename 06/04/11 0514  POCBNP 1284.0*   D-Dimer: No results found for this basename: DDIMER:2 in the last 72 hours CBG: No results found for this basename: GLUCAP:6 in the last 72  hours Hemoglobin A1C: No results found for this basename: HGBA1C in the last 72 hours Fasting Lipid Panel: No results found for this basename: CHOL,HDL,LDLCALC,TRIG,CHOLHDL,LDLDIRECT in the last 72 hours Thyroid Function Tests: No results found for this basename: TSH,T4TOTAL,FREET4,T3FREE,THYROIDAB in the last 72 hours Anemia Panel: No results found for this basename: VITAMINB12,FOLATE,FERRITIN,TIBC,IRON,RETICCTPCT in the last 72 hours Coagulation: No results found for this basename: LABPROT:2,INR:2 in the last 72 hours Urine Drug Screen: Drugs of Abuse     Component Value Date/Time   LABOPIA NONE DETECTED 01/13/2011 1640   COCAINSCRNUR NONE DETECTED 01/13/2011 1640   LABBENZ NONE DETECTED 01/13/2011 1640   AMPHETMU NONE DETECTED 01/13/2011 1640   THCU NONE DETECTED 01/13/2011 1640   LABBARB NONE DETECTED 01/13/2011 1640    Alcohol Level: No results found for this basename: ETH:2 in the last 72 hours Urinalysis:  Misc. Labs:  ABGS No results found for this basename: PHART,PCO2,PO2ART,TCO2,HCO3 in the last 72 hours CULTURES Recent Results (from the past 240 hour(s))  SURGICAL PCR SCREEN     Status: Normal   Collection Time   06/04/11  7:45 AM      Component Value Range Status Comment   MRSA, PCR NEGATIVE  NEGATIVE  Final    Staphylococcus aureus NEGATIVE  NEGATIVE  Final    Studies/Results: Dg Hip Operative Left  06/04/2011  *RADIOLOGY REPORT*  Clinical Data: Left hip fracture.  OPERATIVE LEFT HIP  Comparison: 06/02/2011  Findings: Three intraoperative spot images demonstrates internal fixation of the left intertrochanteric fracture with lateral plate  and dynamic hip screw.  Near anatomic alignment.  IMPRESSION: Internal fixation of the left intertrochanteric fracture. No complicating feature.  Original Report Authenticated By: Cyndie Chime, M.D.    Medications:  Scheduled:   . albuterol  2.5 mg Nebulization QID  . amLODipine  5 mg Oral Daily  . antiseptic oral rinse  15 mL  Mouth Rinse BID  . aspirin  81 mg Oral Daily  . calcium-vitamin D  1 tablet Oral Daily  . ceFAZolin (ANCEF) IV  1 g Intravenous Once  . enoxaparin (LOVENOX) injection  30 mg Subcutaneous Q24H  . memantine  10 mg Oral BID  . metoprolol tartrate  25 mg Oral Daily  . pneumococcal 23 valent vaccine  0.5 mL Intramuscular Tomorrow-1000  . risperiDONE  1 mg Oral BID  . simvastatin  20 mg Oral q1800  . sodium chloride      . sulfamethoxazole-trimethoprim  1 tablet Oral Q12H  . vitamin B-12  1,000 mcg Oral Daily  . DISCONTD: amLODipine  5 mg Oral Daily  . DISCONTD: aspirin EC  81 mg Oral Daily  . DISCONTD: calcium-vitamin D  1 tablet Oral Daily  . DISCONTD: memantine  10 mg Oral BID  . DISCONTD: metoprolol tartrate  25 mg Oral Q0700  . DISCONTD: risperiDONE  1 mg Oral BID  . DISCONTD: simvastatin  20 mg Oral QHS  . DISCONTD: vitamin B-12  1,000 mcg Oral Q0700   Continuous:   . sodium chloride 75 mL/hr at 06/06/11 0559   QMV:[id number], acetaminophen, hydrogen peroxide, magnesium hydroxide, morphine injection, nitroGLYCERIN, promethazine  Assesment: She has a hip fracture. She does have coronary artery occlusive disease. She has atrial fibrillation. She seems to be improving. She is anemic postoperatively and Dr. Hilda Lias has ordered a blood transfusion which is appropriate Principal Problem:  *Hip fracture, intertrochanteric Active Problems:  HYPERLIPIDEMIA-MIXED  HYPERTENSION, UNSPECIFIED  Coronary atherosclerosis of native coronary artery  CAROTID ARTERY DISEASE  Dementia    Plan: For blood transfusion today continue with her treatments.    LOS: 4 days   Johney Perotti L 06/06/2011, 8:51 AM

## 2011-06-06 NOTE — Progress Notes (Signed)
Subjective: 2 Days Post-Op Procedure(s) (LRB): OPEN REDUCTION INTERNAL FIXATION HIP (Left) Patient reports pain as 2 on 0-10 scale.    Objective: Vital signs in last 24 hours: Temp:  [97.4 F (36.3 C)-98.9 F (37.2 C)] 97.4 F (36.3 C) (11/30 0515) Pulse Rate:  [106-110] 110  (11/30 0515) Resp:  [18-20] 20  (11/30 0515) BP: (107-142)/(61-70) 142/70 mmHg (11/30 0515) SpO2:  [92 %-96 %] 93 % (11/30 0515)  Intake/Output from previous day: 11/29 0701 - 11/30 0700 In: 1573.8 [I.V.:1573.8] Out: 620 [Urine:600; Drains:20] Intake/Output this shift:     Basename 06/06/11 0536 06/05/11 0504 06/04/11 0514  HGB 8.9* 10.9* 12.9    Basename 06/06/11 0536 06/05/11 0504  WBC 8.9 9.6  RBC 2.81* 3.51*  HCT 25.6* 31.8*  PLT 124* 145*    Basename 06/06/11 0536 06/05/11 0504  NA 129* 129*  K 4.2 4.1  CL 98 96  CO2 24 24  BUN 19 19  CREATININE 1.19* 1.27*  GLUCOSE 127* 165*  CALCIUM 8.6 9.2   No results found for this basename: LABPT:2,INR:2 in the last 72 hours  Neurovascular intact Sensation intact distally Intact pulses distally Dorsiflexion/Plantar flexion intact No cellulitis present  Hemovac drain removed.  To transfer one unit of blood as post op anemia present post surgery blood loss.  Assessment/Plan: 2 Days Post-Op Procedure(s) (LRB): OPEN REDUCTION INTERNAL FIXATION HIP (Left) Up with therapy  Azam Gervasi 06/06/2011, 8:29 AM

## 2011-06-06 NOTE — Progress Notes (Signed)
Physical Therapy Treatment Patient Details Name: Karen Fowler MRN: [id number] DOB: March 25, 1926 Today's Date: 06/06/2011   TIME:911-950/ 16 mins TE-15 mins TA PT Assessment/Plan  PT - Assessment/Plan Comments on Treatment Session: Pt is very fearful of moving and does not remember falling and repeatedly asks "what happened to me" Due to pt fearfulness of pain upon movement pt is Max to  Total Assist +1 at this time PT Goals  Acute Rehab PT Goals PT Goal: Supine/Side to Sit - Progress: Progressing toward goal PT Goal: Sit to Supine/Side - Progress: Progressing toward goal  PT Treatment Precautions/Restrictions  Precautions Precautions: Fall Required Braces or Orthoses: No Restrictions Weight Bearing Restrictions: Yes LLE Weight Bearing: Touchdown weight bearing (with PT consult per orders) Mobility (including Balance) Bed Mobility Supine to Sit: 1: +1 Total assist Sitting - Scoot to Edge of Bed: 1: +1 Total assist Sit to Supine - Right: 1: +1 Total assist Transfers Stand Pivot Transfers: 1: +1 Total assist Stand Pivot Transfer Details (indicate cue type and reason): pt fearful of moving LLE Ambulation/Gait Ambulation/Gait: No Stairs: No Wheelchair Mobility Wheelchair Mobility: No    Exercise  General Exercises - Upper Extremity Shoulder Flexion: 10 reps;Both General Exercises - Lower Extremity Ankle Circles/Pumps: 10 reps;Both Quad Sets: 10 reps;Both Heel Slides: AAROM;10 reps;Both Hip ABduction/ADduction: AAROM;10 reps;Both End of Session PT - End of Session Equipment Utilized During Treatment: Gait belt Activity Tolerance: Patient limited by pain Patient left: in chair;with call bell in reach (chair alarm set) Nurse Communication: Mobility status for transfers General Behavior During Session: [facility] for tasks performed Cognition: [facility] for tasks performed  [name] 06/06/2011, 10:10 AM

## 2011-06-06 NOTE — Progress Notes (Signed)
UR Chart Review Completed  

## 2011-06-07 LAB — CBC
HCT: 28.6 % — ABNORMAL LOW (ref 36.0–46.0)
Hemoglobin: 9.5 g/dL — ABNORMAL LOW (ref 12.0–15.0)
MCH: 30.5 pg (ref 26.0–34.0)
MCHC: 33.2 g/dL (ref 30.0–36.0)
RBC: 3.11 MIL/uL — ABNORMAL LOW (ref 3.87–5.11)

## 2011-06-07 LAB — TYPE AND SCREEN
ABO/RH(D): A POS
Antibody Screen: NEGATIVE

## 2011-06-07 LAB — BASIC METABOLIC PANEL
BUN: 12 mg/dL (ref 6–23)
CO2: 25 mEq/L (ref 19–32)
Chloride: 99 mEq/L (ref 96–112)
GFR calc non Af Amer: 52 mL/min — ABNORMAL LOW (ref >90)
Glucose, Bld: 120 mg/dL — ABNORMAL HIGH (ref 70–99)
Potassium: 4.2 mEq/L (ref 3.5–5.1)
Sodium: 131 mEq/L — ABNORMAL LOW (ref 135–145)

## 2011-06-07 LAB — DIFFERENTIAL
Basophils Relative: 0 % (ref 0–1)
Eosinophils Relative: 1 % (ref 0–5)
Monocytes Absolute: 0.8 10*3/uL (ref 0.1–1.0)
Neutro Abs: 6.3 10*3/uL (ref 1.7–7.7)
Neutrophils Relative %: 82 % — ABNORMAL HIGH (ref 43–77)

## 2011-06-07 MED ORDER — ALPRAZOLAM 0.5 MG PO TABS
0.5000 mg | ORAL_TABLET | Freq: Three times a day (TID) | ORAL | Status: DC | PRN
  Administered 2011-06-07: 0.5 mg via ORAL
  Filled 2011-06-07: qty 1

## 2011-06-07 NOTE — Progress Notes (Addendum)
Subjective: I am OK.   Objective: Vital signs in last 24 hours: Temp:  [98 F (36.7 C)-99.8 F (37.7 C)] 98.3 F (36.8 C) (12/01 1610) Pulse Rate:  [81-106] 106  (12/01 0717) Resp:  [16-18] 18  (12/01 0717) BP: (103-124)/(60-69) 108/60 mmHg (12/01 0633) SpO2:  [92 %-97 %] 95 % (12/01 0717)  Intake/Output from previous day: 11/30 0701 - 12/01 0700 In: 1052.5 [P.O.:480; I.V.:260; Blood:312.5] Out: 625 [Urine:625] Intake/Output this shift: Total I/O In: -  Out: 1325 [Urine:1325]   Basename 06/07/11 0621 06/06/11 0536 06/05/11 0504  HGB 9.5* 8.9* 10.9*    Basename 06/07/11 0621 06/06/11 0536  WBC 7.7 8.9  RBC 3.11* 2.81*  HCT 28.6* 25.6*  PLT 135* 124*    Basename 06/07/11 0621 06/06/11 0536  NA 131* 129*  K 4.2 4.2  CL 99 98  CO2 25 24  BUN 12 19  CREATININE 0.98 1.19*  GLUCOSE 120* 127*  CALCIUM 8.8 8.6   No results found for this basename: LABPT:2,INR:2 in the last 72 hours  Neurovascular intact Sensation intact distally Intact pulses distally Dorsiflexion/Plantar flexion intact No cellulitis present  Assessment/Plan: She is stable.  She should be ready for skilled nursing home bed first of week.  Due to her dementia she is not able to fully comprehend use of walker at times.   Faruq Rosenberger 06/07/2011, 9:38 AM      Addendum to note:  She had foley removed yesterday.  She was unable to void.  An in and out catheter was done but she continued to have retained urine by bladder scan.  The foley was reinserted secondary to being unable to void.  Also, HGB increased to 9.5 after the transfusion.  Darreld Mclean.

## 2011-06-07 NOTE — Progress Notes (Signed)
Physical Therapy Treatment Patient Details Name: Karen Fowler MRN: [id number] DOB: [birth year] Today's Date: 06/07/2011  PT Assessment/Plan  Pt refused to perform exercises or transfer following explaination of benefits. Pt left in bed with call bell by side.  PT Goals     PT Treatment Precautions/Restrictions  Precautions Precautions: Fall Required Braces or Orthoses: No Restrictions Weight Bearing Restrictions: Yes LLE Weight Bearing: Touchdown weight bearing Mobility (including Balance)      Exercise    End of Session PT - End of Session Patient left: in bed;with call bell in reach  Juel Burrow 06/07/2011, 11:22 AM

## 2011-06-07 NOTE — Progress Notes (Signed)
Subjective: She is overall about the same. She still confused but no worse than usual.  Objective: Vital signs in last 24 hours: Temp:  [98 F (36.7 C)-99.8 F (37.7 C)] 98.3 F (36.8 C) (12/01 1610) Pulse Rate:  [81-106] 106  (12/01 0717) Resp:  [16-18] 18  (12/01 0717) BP: (103-124)/(60-69) 108/60 mmHg (12/01 0633) SpO2:  [92 %-97 %] 95 % (12/01 0717) Weight change:  Last BM Date: 06/01/11  Intake/Output from previous day: 11/30 0701 - 12/01 0700 In: 1052.5 [P.O.:480; I.V.:260; Blood:312.5] Out: 625 [Urine:625]  PHYSICAL EXAM General appearance: no distress Resp: clear to auscultation bilaterally Cardio: regular rate and rhythm, S1, S2 normal, no murmur, click, rub or gallop GI: soft, non-tender; bowel sounds normal; no masses,  no organomegaly Extremities: Post surgery  Lab Results:    Basic Metabolic Panel:  Basename 06/07/11 0621 06/06/11 0536  NA 131* 129*  K 4.2 4.2  CL 99 98  CO2 25 24  GLUCOSE 120* 127*  BUN 12 19  CREATININE 0.98 1.19*  CALCIUM 8.8 8.6  MG -- --  PHOS -- --   Liver Function Tests: No results found for this basename: AST:2,ALT:2,ALKPHOS:2,BILITOT:2,PROT:2,ALBUMIN:2 in the last 72 hours No results found for this basename: LIPASE:2,AMYLASE:2 in the last 72 hours No results found for this basename: AMMONIA:2 in the last 72 hours CBC:  Basename 06/07/11 0621 06/06/11 0536  WBC 7.7 8.9  NEUTROABS 6.3 7.4  HGB 9.5* 8.9*  HCT 28.6* 25.6*  MCV 92.0 91.1  PLT 135* 124*   Cardiac Enzymes: No results found for this basename: CKTOTAL:3,CKMB:3,CKMBINDEX:3,TROPONINI:3 in the last 72 hours BNP: No results found for this basename: POCBNP:3 in the last 72 hours D-Dimer: No results found for this basename: DDIMER:2 in the last 72 hours CBG: No results found for this basename: GLUCAP:6 in the last 72 hours Hemoglobin A1C: No results found for this basename: HGBA1C in the last 72 hours Fasting Lipid Panel: No results found for this  basename: CHOL,HDL,LDLCALC,TRIG,CHOLHDL,LDLDIRECT in the last 72 hours Thyroid Function Tests: No results found for this basename: TSH,T4TOTAL,FREET4,T3FREE,THYROIDAB in the last 72 hours Anemia Panel: No results found for this basename: VITAMINB12,FOLATE,FERRITIN,TIBC,IRON,RETICCTPCT in the last 72 hours Coagulation: No results found for this basename: LABPROT:2,INR:2 in the last 72 hours Urine Drug Screen: Drugs of Abuse     Component Value Date/Time   LABOPIA NONE DETECTED 01/13/2011 1640   COCAINSCRNUR NONE DETECTED 01/13/2011 1640   LABBENZ NONE DETECTED 01/13/2011 1640   AMPHETMU NONE DETECTED 01/13/2011 1640   THCU NONE DETECTED 01/13/2011 1640   LABBARB NONE DETECTED 01/13/2011 1640    Alcohol Level: No results found for this basename: ETH:2 in the last 72 hours Urinalysis:  Misc. Labs:  ABGS No results found for this basename: PHART,PCO2,PO2ART,TCO2,HCO3 in the last 72 hours CULTURES Recent Results (from the past 240 hour(s))  SURGICAL PCR SCREEN     Status: Normal   Collection Time   06/04/11  7:45 AM      Component Value Range Status Comment   MRSA, PCR NEGATIVE  NEGATIVE  Final    Staphylococcus aureus NEGATIVE  NEGATIVE  Final    Studies/Results: No results found.  Medications:  Scheduled:   . albuterol  2.5 mg Nebulization QID  . amLODipine  5 mg Oral Daily  . antiseptic oral rinse  15 mL Mouth Rinse BID  . aspirin  81 mg Oral Daily  . calcium-vitamin D  1 tablet Oral Daily  . ceFAZolin (ANCEF) IV  1 g Intravenous Once  .  enoxaparin (LOVENOX) injection  30 mg Subcutaneous Q24H  . memantine  10 mg Oral BID  . metoprolol tartrate  25 mg Oral Daily  . risperiDONE  1 mg Oral BID  . simvastatin  20 mg Oral q1800  . sodium chloride      . sulfamethoxazole-trimethoprim  1 tablet Oral Q12H  . vitamin B-12  1,000 mcg Oral Daily   Continuous:   . sodium chloride 75 mL/hr at 06/06/11 0559   ZOX:[id number], acetaminophen, hydrogen peroxide, magnesium hydroxide,  morphine injection, nitroGLYCERIN, promethazine  Assesment: She has had a hip fracture. She has multiple other medical problems including coronary artery occlusive disease she has not had any chest pain. She has atrial fibrillation that she is not a candidate for Coumadin. She has dementia which is unchanged.  Principal Problem:  *Hip fracture, intertrochanteric Active Problems:  HYPERLIPIDEMIA-MIXED  HYPERTENSION, UNSPECIFIED  Coronary atherosclerosis of native coronary artery  CAROTID ARTERY DISEASE  Dementia  Atrial fibrillation    I do not plan to change her treatments. She is being scheduled for nursing home placement Plan:    LOS: 5 days   [place] Severs L 06/07/2011, 9:55 AM

## 2011-06-07 NOTE — Progress Notes (Signed)
CSW spoke with Terrence Dupont at [facility].  She states she has spoken with Pt's family and they would like to explore bed options in [place], [place].  CSW faxed Pt out to [place] and received a bed offer from [facility].  Juliette Alcide accepted this bed on behalf of Pt and agreed to contact the facility to complete any necessary paperwork.  CSW will continue to follow and assist with D/C as needed.

## 2011-06-08 MED ORDER — SODIUM CHLORIDE 0.9 % IJ SOLN
INTRAMUSCULAR | Status: AC
  Administered 2011-06-08: 15:00:00
  Filled 2011-06-08: qty 3

## 2011-06-08 MED ORDER — POVIDONE-IODINE 10 % EX SOLN: Freq: Once | CUTANEOUS | Status: DC

## 2011-06-08 MED ORDER — CEFAZOLIN SODIUM 1-5 GM-% IV SOLN: 1.0000 g | Freq: Once | INTRAVENOUS | Status: DC

## 2011-06-08 NOTE — Progress Notes (Signed)
Subjective: I am OK.   Objective: Vital signs in last 24 hours: Temp:  [98.1 F (36.7 C)-98.4 F (36.9 C)] 98.1 F (36.7 C) (12/02 0554) Pulse Rate:  [102-111] 103  (12/02 0554) Resp:  [17-18] 18  (12/02 0554) BP: (104-128)/(63-67) 128/67 mmHg (12/02 0554) SpO2:  [91 %-95 %] 93 % (12/02 1121)  Intake/Output from previous day: 12/01 0701 - 12/02 0700 In: 5691 [P.O.:830; I.V.:3636] Out: 2875 [Urine:2875] Intake/Output this shift:     Basename 06/07/11 0621 06/06/11 0536  HGB 9.5* 8.9*    Basename 06/07/11 0621 06/06/11 0536  WBC 7.7 8.9  RBC 3.11* 2.81*  HCT 28.6* 25.6*  PLT 135* 124*    Basename 06/07/11 0621 06/06/11 0536  NA 131* 129*  K 4.2 4.2  CL 99 98  CO2 25 24  BUN 12 19  CREATININE 0.98 1.19*  GLUCOSE 120* 127*  CALCIUM 8.8 8.6   No results found for this basename: LABPT:2,INR:2 in the last 72 hours  Neurovascular intact Sensation intact distally Intact pulses distally No cellulitis present  Assessment/Plan: Doing well.  Await SNF bed.   Karen Fowler 06/08/2011, 12:01 PM

## 2011-06-08 NOTE — Progress Notes (Signed)
Subjective: She is overall about the same. She has no new complaints. She says she is not having any pain. She remains confused.  Objective: Vital signs in last 24 hours: Temp:  [98.1 F (36.7 C)-98.4 F (36.9 C)] 98.1 F (36.7 C) (12/02 0554) Pulse Rate:  [102-111] 103  (12/02 0554) Resp:  [17-18] 18  (12/02 0554) BP: (104-128)/(63-67) 128/67 mmHg (12/02 0554) SpO2:  [91 %-95 %] 93 % (12/02 0716) Weight change:  Last BM Date: 06/01/11  Intake/Output from previous day: 12/01 0701 - 12/02 0700 In: 5691 [P.O.:830; I.V.:3636] Out: 2875 [Urine:2875]  PHYSICAL EXAM General appearance: alert, no distress and Continues Resp: clear to auscultation bilaterally Cardio: irregularly irregular rhythm GI: soft, non-tender; bowel sounds normal; no masses,  no organomegaly Extremities: extremities normal, atraumatic, no cyanosis or edema  Lab Results:    Basic Metabolic Panel:  Basename 06/07/11 0621 06/06/11 0536  NA 131* 129*  K 4.2 4.2  CL 99 98  CO2 25 24  GLUCOSE 120* 127*  BUN 12 19  CREATININE 0.98 1.19*  CALCIUM 8.8 8.6  MG -- --  PHOS -- --   Liver Function Tests: No results found for this basename: AST:2,ALT:2,ALKPHOS:2,BILITOT:2,PROT:2,ALBUMIN:2 in the last 72 hours No results found for this basename: LIPASE:2,AMYLASE:2 in the last 72 hours No results found for this basename: AMMONIA:2 in the last 72 hours CBC:  Basename 06/07/11 0621 06/06/11 0536  WBC 7.7 8.9  NEUTROABS 6.3 7.4  HGB 9.5* 8.9*  HCT 28.6* 25.6*  MCV 92.0 91.1  PLT 135* 124*   Cardiac Enzymes: No results found for this basename: CKTOTAL:3,CKMB:3,CKMBINDEX:3,TROPONINI:3 in the last 72 hours BNP: No results found for this basename: POCBNP:3 in the last 72 hours D-Dimer: No results found for this basename: DDIMER:2 in the last 72 hours CBG: No results found for this basename: GLUCAP:6 in the last 72 hours Hemoglobin A1C: No results found for this basename: HGBA1C in the last 72  hours Fasting Lipid Panel: No results found for this basename: CHOL,HDL,LDLCALC,TRIG,CHOLHDL,LDLDIRECT in the last 72 hours Thyroid Function Tests: No results found for this basename: TSH,T4TOTAL,FREET4,T3FREE,THYROIDAB in the last 72 hours Anemia Panel: No results found for this basename: VITAMINB12,FOLATE,FERRITIN,TIBC,IRON,RETICCTPCT in the last 72 hours Coagulation: No results found for this basename: LABPROT:2,INR:2 in the last 72 hours Urine Drug Screen: Drugs of Abuse     Component Value Date/Time   LABOPIA NONE DETECTED 01/13/2011 1640   COCAINSCRNUR NONE DETECTED 01/13/2011 1640   LABBENZ NONE DETECTED 01/13/2011 1640   AMPHETMU NONE DETECTED 01/13/2011 1640   THCU NONE DETECTED 01/13/2011 1640   LABBARB NONE DETECTED 01/13/2011 1640    Alcohol Level: No results found for this basename: ETH:2 in the last 72 hours Urinalysis:  Misc. Labs:  ABGS No results found for this basename: PHART,PCO2,PO2ART,TCO2,HCO3 in the last 72 hours CULTURES Recent Results (from the past 240 hour(s))  SURGICAL PCR SCREEN     Status: Normal   Collection Time   06/04/11  7:45 AM      Component Value Range Status Comment   MRSA, PCR NEGATIVE  NEGATIVE  Final    Staphylococcus aureus NEGATIVE  NEGATIVE  Final    Studies/Results: No results found.  Medications:  Scheduled:   . albuterol  2.5 mg Nebulization QID  . amLODipine  5 mg Oral Daily  . antiseptic oral rinse  15 mL Mouth Rinse BID  . aspirin  81 mg Oral Daily  . calcium-vitamin D  1 tablet Oral Daily  . ceFAZolin (ANCEF) IV  1 g Intravenous Once  . enoxaparin (LOVENOX) injection  30 mg Subcutaneous Q24H  . memantine  10 mg Oral BID  . metoprolol tartrate  25 mg Oral Daily  . risperiDONE  1 mg Oral BID  . simvastatin  20 mg Oral q1800  . sulfamethoxazole-trimethoprim  1 tablet Oral Q12H  . vitamin B-12  1,000 mcg Oral Daily  . DISCONTD: ceFAZolin (ANCEF) IV  1 g Intravenous Once  . DISCONTD: povidone-iodine   Topical Once    Continuous:   . sodium chloride 75 mL/hr at 06/08/11 0509   ZOX:[id number], acetaminophen, ALPRAZolam, hydrogen peroxide, magnesium hydroxide, morphine injection, nitroGLYCERIN, promethazine  Assesment: She had a hip fracture and surgery. She has dementia which is unchanged. She has chronic atrial fibrillation but is not a candidate for Coumadin. Principal Problem:  *Hip fracture, intertrochanteric Active Problems:  HYPERLIPIDEMIA-MIXED  HYPERTENSION, UNSPECIFIED  Coronary atherosclerosis of native coronary artery  CAROTID ARTERY DISEASE  Dementia  Atrial fibrillation    Plan: No change in treatment I will continue her medications she may be ready for transfer to a skilled care facility tomorrow but I need to discuss that with Dr. Hilda Lias    LOS: 6 days   Karen Fowler 06/08/2011, 9:36 AM

## 2011-06-09 HISTORY — PX: FRACTURE SURGERY: SHX138

## 2011-06-09 NOTE — Discharge Summary (Signed)
Physician Discharge Summary  Patient ID: Karen Fowler MRN: [id number] DOB/AGE: 09-Nov-1925 77 y.o. Primary Care Physician:Finn Amos L, MD Admit date: 06/02/2011 Discharge date: 06/09/2011    Discharge Diagnoses:   Principal Problem:  *Hip fracture, intertrochanteric Active Problems:  HYPERLIPIDEMIA-MIXED  HYPERTENSION, UNSPECIFIED  Coronary atherosclerosis of native coronary artery  CAROTID ARTERY DISEASE  Dementia  Atrial fibrillation   Current Discharge Medication List    STOP taking these medications     cephALEXin (KEFLEX) 500 MG capsule      sulfamethoxazole-trimethoprim (SEPTRA DS) 800-160 MG per tablet      amLODipine (NORVASC) 5 MG tablet      aspirin EC 81 MG tablet      calcium-vitamin D (OSCAL WITH D) 500-200 MG-UNIT per tablet      memantine (NAMENDA) 10 MG tablet      metoprolol tartrate (LOPRESSOR) 25 MG tablet      risperiDONE (RISPERDAL) 0.5 MG tablet      simvastatin (ZOCOR) 20 MG tablet      vitamin B-12 (CYANOCOBALAMIN) 1000 MCG tablet      nitroGLYCERIN (NITROSTAT) 0.4 MG SL tablet         Discharged Condition: Improved postop    Consults: Dr. Hilda Lias  Significant Diagnostic Studies: Dg Chest 1 View  06/02/2011  *RADIOLOGY REPORT*  Clinical Data: Left hip fracture.  Pain after falling.  CHEST - 1 VIEW  Comparison: 01/13/2011  Findings: The patient has had a median sternotomy and CABG.  Heart is mildly enlarged.  The patient is rotated towards the left. There are no focal consolidations or pleural effusions.  There is deformity of the right fifth posterior lateral rib consistent with fracture.  The appearance favors old fracture and correlation is recommended with history of chest injury today.  No evidence for pneumothorax.  Degenerative changes are seen in the spine.  IMPRESSION:  1.  Postoperative changes. 2.  Cardiomegaly without pulmonary edema. 3.  Probable old right rib fracture.  See above.  Original Report Authenticated  By: Patterson Hammersmith, M.D.   Dg Pelvis 1-2 Views  06/02/2011  *RADIOLOGY REPORT*  Clinical Data: Trauma.  Left hip pain after falling.  PELVIS - 1-2 VIEW  Comparison: 05/23/2011  Findings: There is a comminuted intertrochanteric fracture of the left hip, associated with varus angulation.  The femoral head appears located within the acetabulum on this frontal view.  Bones appear osteopenic.  IMPRESSION: Comminuted intertrochanteric fracture of the left hip.  Original Report Authenticated By: Patterson Hammersmith, M.D.   Dg Pelvis 1-2 Views  05/23/2011  *RADIOLOGY REPORT*  Clinical Data: Right hip pain  PELVIS - 1-2 VIEW  Comparison: None  Findings: Artifact or soft tissue opacity noted over the right upper pelvis.  No displaced pelvic fracture.  Lumbar spine degenerative changes are noted.  IMPRESSION: No acute abnormality.  Artifact or possibly soft tissue opacity over the right upper pelvis.  Original Report Authenticated By: Harrel Lemon, M.D.   Dg Hip Operative Left  06/04/2011  *RADIOLOGY REPORT*  Clinical Data: Left hip fracture.  OPERATIVE LEFT HIP  Comparison: 06/02/2011  Findings: Three intraoperative spot images demonstrates internal fixation of the left intertrochanteric fracture with lateral plate and dynamic hip screw.  Near anatomic alignment.  IMPRESSION: Internal fixation of the left intertrochanteric fracture. No complicating feature.  Original Report Authenticated By: Cyndie Chime, M.D.   Dg Femur Left  06/02/2011  *RADIOLOGY REPORT*  Clinical Data: Left hip pain after falling today.  LEFT FEMUR - 2  VIEW  Comparison: Pelvis 06/02/2011  Findings: There is a comminuted intertrochanteric fracture of the left hip.  There is associated varus angulation.  The femoral head is located within the acetabulum based on the lateral view. Degenerative changes are seen in the knee.  There is atherosclerotic calcification of the superficial femoral artery. No other fractures are identified.   IMPRESSION: Comminuted intertrochanteric fracture of the left hip.  Original Report Authenticated By: Patterson Hammersmith, M.D.   Dg Femur Right  05/23/2011  *RADIOLOGY REPORT*  Clinical Data: Right-sided thigh pain, cellulitis  RIGHT FEMUR - 2 VIEW  Comparison: None.  Findings: No fracture identified.  No radiopaque foreign body.  No soft tissue abnormality.  Degenerative changes are partly visualized in the lumbar spine.  Vascular calcifications noted.  IMPRESSION: No acute abnormality.  Original Report Authenticated By: Harrel Lemon, M.D.    Lab Results: Basic Metabolic Panel:  Basename 06/07/11 0621  NA 131*  K 4.2  CL 99  CO2 25  GLUCOSE 120*  BUN 12  CREATININE 0.98  CALCIUM 8.8  MG --  PHOS --   Liver Function Tests: No results found for this basename: AST:2,ALT:2,ALKPHOS:2,BILITOT:2,PROT:2,ALBUMIN:2 in the last 72 hours   CBC:  Basename 06/07/11 0621  WBC 7.7  NEUTROABS 6.3  HGB 9.5*  HCT 28.6*  MCV 92.0  PLT 135*    Recent Results (from the past 240 hour(s))  SURGICAL PCR SCREEN     Status: Normal   Collection Time   06/04/11  7:45 AM      Component Value Range Status Comment   MRSA, PCR NEGATIVE  NEGATIVE  Final    Staphylococcus aureus NEGATIVE  NEGATIVE  Final      Hospital Course: She was admitted with a hip fracture. She also has problems with dementia and multiple other difficulties. She was evaluated by cardiology prior to surgery and felt to be okay for planned surgery and that was eventually done. She improved postop was able to do some physical therapy and is being transferred to a skilled care facility.  Discharge Exam: Blood pressure 133/74, pulse 74, temperature 97.9 F (36.6 C), temperature source Axillary, resp. rate 16, height 5\' 3"  (1.6 m), weight 66.9 kg (147 lb 7.8 oz), SpO2 96.00%. She is still somewhat confused. Her chest is clear. She is in atrial fibrillation.  Disposition: She will be transferred to skilled care facility. She  will have PT/OT. Her staples can be removed 06/16/2011. She should see Dr. Hilda Lias in his office in 3 weeks and have an x-ray prior to her appointment at his office. She can do toe touch on her left leg. She will be on a regular no added salt diet. Medications at the skilled care facility will be as follows Tylenol 500 mg every 4 hours when necessary mild pain Xanax 0.5 mg by mouth 3 times a day when necessary anxiety Nitroglycerin 0.4 mg sublingual every 5 minutes when necessary chest pain. Albuterol nebulizer treatments 4 times a day this is 2.5 mg Amlodipine 5 mg daily Aspirin 81 mg daily Os-Cal with D5 100/200 one twice a day Lovenox 30 mg daily until she is ambulatory Namenda 10 mg twice a day Metoprolol 25 mg daily extended release Risperidone 1 mg twice a day Simvastatin 20 mg daily Bactrim DS 1 by mouth twice a day x5 more days Vitamin B 12 tablet 1000 mcg daily   Discharge Orders    Future Appointments: Provider: Department: Dept Phone: Center:   06/24/2011 1:00 PM  Gerrit Friends. Dietrich Pates, MD [facility] [phone] [id number]     Future Orders Please Complete By Expires   Discharge to SNF when bed available           Signed: Ludean Duhart L 06/09/2011, 8:28 AM

## 2011-06-09 NOTE — Progress Notes (Signed)
CARE MANAGEMENT NOTE 06/09/2011  Patient:  Karen Fowler, Karen Fowler   Account Number:  [id number]  Date Initiated:  06/05/2011  Documentation initiated by:  Rosemary Holms  Subjective/Objective Assessment:   Pt admited with hip fracture     Action/Plan:   pta, pt lived at [facility], anticipate return to [facility]   Anticipated DC Date:  06/11/2011   Anticipated DC Plan:  SKILLED NURSING FACILITY      DC Planning Services  CM consult      Choice offered to / List presented to:             Status of service:  In process, will continue to follow Medicare Important Message given?  YES (If response is "NO", the following Medicare IM given date fields will be blank) Date Medicare IM given:  06/05/2011 Date Additional Medicare IM given:    Discharge Disposition:    Per UR Regulation:    Comments:  06/09/11 1200 Karen Fowler Leanord Hawking RN BSN To be discharged when bed available at [facility]

## 2011-06-09 NOTE — Progress Notes (Signed)
Subjective: No change   Objective: Vital signs in last 24 hours: Temp:  [97.9 F (36.6 C)-100.3 F (37.9 C)] 97.9 F (36.6 C) (12/03 0630) Pulse Rate:  [74-95] 74  (12/03 0630) Resp:  [16-18] 16  (12/03 0630) BP: (120-146)/(69-74) 133/74 mmHg (12/03 0630) SpO2:  [92 %-96 %] 96 % (12/03 0750)  Intake/Output from previous day: 12/02 0701 - 12/03 0700 In: 100 [P.O.:100] Out: 1950 [Urine:1950] Intake/Output this shift:     Basename 06/07/11 0621  HGB 9.5*    Basename 06/07/11 0621  WBC 7.7  RBC 3.11*  HCT 28.6*  PLT 135*    Basename 06/07/11 0621  NA 131*  K 4.2  CL 99  CO2 25  BUN 12  CREATININE 0.98  GLUCOSE 120*  CALCIUM 8.8   No results found for this basename: LABPT:2,INR:2 in the last 72 hours  Neurovascular intact Sensation intact distally Intact pulses distally No cellulitis present  Assessment/Plan: She can go to skilled nursing home.  She will need staples removed next Monday, December 10 and steri-strip wound. PT/OT with toe touch on the left. I can see in office in three weeks.  X-rays prior to coming to office.   Karen Fowler 06/09/2011, 8:05 AM

## 2011-06-09 NOTE — Progress Notes (Signed)
Discharge Summary: alert with confusion. Vss. Saline lock removed. Foley removed. Bedrest. Dressing to left hip clean dry and intact. Report called to [facility], LPN at [facility], [place]. Caregiver verbalized understanding of instructions. Left floor via stretcher with EMS.

## 2011-06-09 NOTE — Progress Notes (Signed)
Pt to D/C today to the [facility] in [place].  CSW spoke with Melissa at [facility], and with staff at facility.  Both are in agreement with D/C plan.  Pt to be transported by EMS.  CSW will sign off at this time.

## 2011-06-09 NOTE — Progress Notes (Signed)
Subjective: She is overall about the same. She denies pain.  Objective: Vital signs in last 24 hours: Temp:  [97.9 F (36.6 C)-100.3 F (37.9 C)] 97.9 F (36.6 C) (12/03 0630) Pulse Rate:  [74-95] 74  (12/03 0630) Resp:  [16-18] 16  (12/03 0630) BP: (120-146)/(69-74) 133/74 mmHg (12/03 0630) SpO2:  [92 %-96 %] 96 % (12/03 0750) Weight change:  Last BM Date: 07/01/11  Intake/Output from previous day: 12/02 0701 - 12/03 0700 In: 100 [P.O.:100] Out: 1950 [Urine:1950]  PHYSICAL EXAM General appearance: alert and no distress Resp: clear to auscultation bilaterally Cardio: regular rate and rhythm, S1, S2 normal, no murmur, click, rub or gallop GI: soft, non-tender; bowel sounds normal; no masses,  no organomegaly Extremities: extremities normal, atraumatic, no cyanosis or edema  Lab Results:    Basic Metabolic Panel:  Basename 06/07/11 0621  NA 131*  K 4.2  CL 99  CO2 25  GLUCOSE 120*  BUN 12  CREATININE 0.98  CALCIUM 8.8  MG --  PHOS --   Liver Function Tests: No results found for this basename: AST:2,ALT:2,ALKPHOS:2,BILITOT:2,PROT:2,ALBUMIN:2 in the last 72 hours No results found for this basename: LIPASE:2,AMYLASE:2 in the last 72 hours No results found for this basename: AMMONIA:2 in the last 72 hours CBC:  Basename 06/07/11 0621  WBC 7.7  NEUTROABS 6.3  HGB 9.5*  HCT 28.6*  MCV 92.0  PLT 135*   Cardiac Enzymes: No results found for this basename: CKTOTAL:3,CKMB:3,CKMBINDEX:3,TROPONINI:3 in the last 72 hours BNP: No results found for this basename: POCBNP:3 in the last 72 hours D-Dimer: No results found for this basename: DDIMER:2 in the last 72 hours CBG: No results found for this basename: GLUCAP:6 in the last 72 hours Hemoglobin A1C: No results found for this basename: HGBA1C in the last 72 hours Fasting Lipid Panel: No results found for this basename: CHOL,HDL,LDLCALC,TRIG,CHOLHDL,LDLDIRECT in the last 72 hours Thyroid Function Tests: No  results found for this basename: TSH,T4TOTAL,FREET4,T3FREE,THYROIDAB in the last 72 hours Anemia Panel: No results found for this basename: VITAMINB12,FOLATE,FERRITIN,TIBC,IRON,RETICCTPCT in the last 72 hours Coagulation: No results found for this basename: LABPROT:2,INR:2 in the last 72 hours Urine Drug Screen: Drugs of Abuse     Component Value Date/Time   LABOPIA NONE DETECTED 01/13/2011 1640   COCAINSCRNUR NONE DETECTED 01/13/2011 1640   LABBENZ NONE DETECTED 01/13/2011 1640   AMPHETMU NONE DETECTED 01/13/2011 1640   THCU NONE DETECTED 01/13/2011 1640   LABBARB NONE DETECTED 01/13/2011 1640    Alcohol Level: No results found for this basename: ETH:2 in the last 72 hours Urinalysis:  Misc. Labs:  ABGS No results found for this basename: PHART,PCO2,PO2ART,TCO2,HCO3 in the last 72 hours CULTURES Recent Results (from the past 240 hour(s))  SURGICAL PCR SCREEN     Status: Normal   Collection Time   06/04/11  7:45 AM      Component Value Range Status Comment   MRSA, PCR NEGATIVE  NEGATIVE  Final    Staphylococcus aureus NEGATIVE  NEGATIVE  Final    Studies/Results: No results found.  Medications:  Prior to Admission:  Prescriptions prior to admission  Medication Sig Dispense Refill  . amLODipine (NORVASC) 5 MG tablet Take 5 mg by mouth daily.       Marland Kitchen aspirin EC 81 MG tablet Take 81 mg by mouth daily.        . calcium-vitamin D (OSCAL WITH D) 500-200 MG-UNIT per tablet Take 1 tablet by mouth daily.       . cephALEXin (KEFLEX)  500 MG capsule Take 1 capsule (500 mg total) by mouth 4 (four) times daily.  40 capsule  0  . memantine (NAMENDA) 10 MG tablet Take 10 mg by mouth 2 (two) times daily.        . metoprolol tartrate (LOPRESSOR) 25 MG tablet Take 25 mg by mouth every morning.        . risperiDONE (RISPERDAL) 0.5 MG tablet Take 1 mg by mouth 2 (two) times daily. Take one tablet by mouth every morning and take two tablets every day at bedtime      . simvastatin (ZOCOR) 20 MG tablet  Take 20 mg by mouth at bedtime.       . sulfamethoxazole-trimethoprim (SEPTRA DS) 800-160 MG per tablet Take 1 tablet by mouth 2 (two) times daily.  20 tablet  0  . vitamin B-12 (CYANOCOBALAMIN) 1000 MCG tablet Take 1,000 mcg by mouth every morning.        . nitroGLYCERIN (NITROSTAT) 0.4 MG SL tablet Place 0.4 mg under the tongue every 5 (five) minutes as needed.        Scheduled:   . albuterol  2.5 mg Nebulization QID  . amLODipine  5 mg Oral Daily  . antiseptic oral rinse  15 mL Mouth Rinse BID  . aspirin  81 mg Oral Daily  . calcium-vitamin D  1 tablet Oral Daily  . ceFAZolin (ANCEF) IV  1 g Intravenous Once  . enoxaparin (LOVENOX) injection  30 mg Subcutaneous Q24H  . memantine  10 mg Oral BID  . metoprolol tartrate  25 mg Oral Daily  . risperiDONE  1 mg Oral BID  . simvastatin  20 mg Oral q1800  . sodium chloride      . sulfamethoxazole-trimethoprim  1 tablet Oral Q12H  . vitamin B-12  1,000 mcg Oral Daily   Continuous:   . sodium chloride 75 mL/hr at 06/08/11 0509   [id number], acetaminophen, ALPRAZolam, hydrogen peroxide, magnesium hydroxide, morphine injection, nitroGLYCERIN, promethazine  Assesment: She is improved. According to Dr. Hilda Lias is noted she is okay to go to the skilled care facility Principal Problem:  *Hip fracture, intertrochanteric Active Problems:  HYPERLIPIDEMIA-MIXED  HYPERTENSION, UNSPECIFIED  Coronary atherosclerosis of native coronary artery  CAROTID ARTERY DISEASE  Dementia  Atrial fibrillation    Plan: Transfer to skilled care facility when bed is available    LOS: 7 days   Karen Fowler L 06/09/2011, 8:24 AM

## 2011-06-10 NOTE — Progress Notes (Signed)
Utilization review completed.  

## 2011-06-11 ENCOUNTER — Encounter (HOSPITAL_COMMUNITY): Payer: Self-pay | Admitting: Orthopaedic Surgery

## 2011-06-12 ENCOUNTER — Encounter: Payer: Self-pay | Admitting: Cardiology

## 2011-06-24 ENCOUNTER — Ambulatory Visit: Payer: Medicare Other | Admitting: Cardiology

## 2011-06-27 ENCOUNTER — Ambulatory Visit: Payer: Medicare Other | Admitting: Cardiology

## 2011-08-15 ENCOUNTER — Ambulatory Visit: Payer: Medicare Other | Admitting: Cardiology

## 2011-08-18 ENCOUNTER — Ambulatory Visit (INDEPENDENT_AMBULATORY_CARE_PROVIDER_SITE_OTHER): Payer: Medicare Other | Admitting: Cardiology

## 2011-08-18 ENCOUNTER — Encounter: Payer: Self-pay | Admitting: Cardiology

## 2011-08-18 VITALS — BP 101/60 | HR 108 | Resp 18

## 2011-08-18 DIAGNOSIS — N390 Urinary tract infection, site not specified: Secondary | ICD-10-CM

## 2011-08-18 DIAGNOSIS — I779 Disorder of arteries and arterioles, unspecified: Secondary | ICD-10-CM

## 2011-08-18 DIAGNOSIS — S72142A Displaced intertrochanteric fracture of left femur, initial encounter for closed fracture: Secondary | ICD-10-CM

## 2011-08-18 DIAGNOSIS — A419 Sepsis, unspecified organism: Secondary | ICD-10-CM

## 2011-08-18 MED ORDER — LEVOFLOXACIN 500 MG PO TABS: 500.0000 mg | ORAL_TABLET | Freq: Every day | ORAL | Status: DC

## 2011-08-18 NOTE — Patient Instructions (Signed)
Your physician recommends that you schedule a follow-up appointment in: 6 months. You will receive a reminder letter in the mail about 1-2 months in advance, reminding you to call our office and schedule your appointment. If you don't receive this letter, please contact our office.  Your physician recommends that you continue on your current medications as directed. Please refer to the Current Medication list given to you today.   START LEVAQUIN 500 MG ONE BY MOUTH DAILY.

## 2011-08-18 NOTE — Progress Notes (Signed)
Peyton Bottoms, MD, [facility] ABIM Board Certified in Adult Cardiovascular Medicine,Internal Medicine and Critical Care Medicine    CC: Follow patient with a history of coronary artery disease.  HPI: The patient is a 78 year old female status post recent hip fracture requiring ORIF and then complicated by UTI sepsis. The patient has severe dementia she does know her name but doesn't know where she is and actually doesn't recognize me anymore. The assistant that came with her report that she still has frequent cough with possible purulent phlegm. She has no fever or chills. She has had no further followup in regards to carotid artery disease. She denies any chest pain shortness of breath orthopnea PND. Her dementia has significantly worsened and the patient has also now developed limited mobility. She's sitting in a wheelchair during this visit.  PMH: reviewed and listed in Problem List in Electronic Records (and see below) Past Medical History  Diagnosis Date  . Mitral valve insufficiency and aortic valve insufficiency     mild mitral regurgitation  . Hypertension   . Hyperlipidemia   . Coronary atherosclerosis of native coronary artery 2006    CABG in 2006  . Anemia, unspecified   . Dementia     Severe  . Closed intertrochanteric fracture of left hip     Status post ORIF  . Carotid artery disease     60-79% left ICA stenosis and 40-59% right internal carotid artery stenosis.   . Sepsis secondary to UTI     Admitted January 2013   Past Surgical History  Procedure Date  . Coronary artery bypass graft 2006    Dr. Barry Dienes  . Abdominal hysterectomy   . Orif hip fracture 06/04/2011    Procedure: OPEN REDUCTION INTERNAL FIXATION HIP;  Surgeon: Darreld Mclean;  Location: [facility];  Service: Orthopedics;  Laterality: Left;    Allergies/SH/FHX : available in Electronic Records for review  No Known Allergies History   Social History  . Marital Status: Widowed    Spouse Name: N/A    Number  of Children: N/A  . Years of Education: N/A   Occupational History  . RETIRED    Social History Main Topics  . Smoking status: Never Smoker   . Smokeless tobacco: Never Used  . Alcohol Use: No  . Drug Use: No  . Sexually Active: No   Other Topics Concern  . Not on file   Social History Narrative  . No narrative on file   Family History  Problem Relation Age of Onset  . Coronary artery disease      FAMILY HISTORY    Medications: Current Outpatient Prescriptions  Medication Sig Dispense Refill  . amLODipine (NORVASC) 5 MG tablet Take 5 mg by mouth daily.      Marland Kitchen atorvastatin (LIPITOR) 10 MG tablet Take 10 mg by mouth daily.      . Bisacodyl (DULCOLAX PO) Take by mouth daily.      . Calcium Carbonate-Vitamin D (OSCAL 500/200 D-3 PO) Take by mouth daily.      . Cyanocobalamin (VITAMIN B 12 PO) Take by mouth.      . levofloxacin (LEVAQUIN) 500 MG tablet Take 1 tablet (500 mg total) by mouth daily.  7 tablet  0  . megestrol (MEGACE) 40 MG/ML suspension Take 200 mg by mouth as directed.      . memantine (NAMENDA) 5 MG tablet Take 5 mg by mouth 2 (two) times daily.      . metoprolol succinate (TOPROL-XL)  25 MG 24 hr tablet Take 25 mg by mouth daily.      . Multiple Vitamins-Minerals (MULTI COMPLETE PO) Take by mouth daily.      . Nebulizer MISC by Does not apply route as directed.      Marland Kitchen omeprazole (PRILOSEC) 20 MG capsule Take 20 mg by mouth daily.      . risperiDONE (RISPERDAL) 1 MG tablet Take 1 mg by mouth 2 (two) times daily.        ROS: No nausea or vomiting. No fever or chills.No melena or hematochezia.No bleeding.No claudication  Physical Exam: BP 101/60  Pulse 108  Resp 18 General: Well-nourished white female sitting in a wheelchair unaware who I  am or where she is Neck: Normal carotid upstroke with soft bilateral carotid bruits bilaterally. No thyromegaly nonnodular thyroid. Lungs: No  wheezing but rhonchi and crackles particularly on the right side Cardiac:  Regular rate and rhythm with normal S1-S2 and a very soft holosystolic murmur at the apex Vascular: No edema. Normal distal pulses bilaterally Skin: Warm and dry Physcologic: Severe dementia, unable to respond to number of questions  12lead ECG: Not available Limited bedside ECHO:N/A   Patient Active Problem List  Diagnoses  . HYPERLIPIDEMIA-MIXED  . MITRAL REGURGITATION, 0 (MILD)  . HYPERTENSION, UNSPECIFIED  . Coronary atherosclerosis of native coronary artery  . CAROTID ARTERY DISEASE-significant   . Dementia-severe   . Atrial fibrillation-normal sinus rhythm   . Sepsis secondary to UTI  . Closed intertrochanteric fracture of left hip    PLAN   The patient still has possible bronchitis. She continues to have a hacking cough with some purulence and has rhonchi and crackles on the right side. I have given her a refill on her Levaquin 500 mg by mouth daily x7 days.  In regards to carotid artery disease although significant I do not think any followup carotid Dopplers are warned that given the fact that the patient has very severe dementia and does not seem to be a future surgical candidate.  The patient is also rehabilitating from a hip fracture and recent UTI sepsis. If there is improvement in her mental status in the future further attention can be turned towards her carotid artery disease.  She reports no chest pain and in regards to her coronary artery disease she is stable       i

## 2012-05-17 ENCOUNTER — Encounter: Payer: Self-pay | Admitting: Vascular Surgery

## 2012-07-12 ENCOUNTER — Other Ambulatory Visit: Payer: Self-pay | Admitting: *Deleted

## 2012-07-12 DIAGNOSIS — I6529 Occlusion and stenosis of unspecified carotid artery: Secondary | ICD-10-CM

## 2012-07-14 ENCOUNTER — Encounter: Payer: Self-pay | Admitting: Neurosurgery

## 2012-07-15 ENCOUNTER — Other Ambulatory Visit (INDEPENDENT_AMBULATORY_CARE_PROVIDER_SITE_OTHER): Payer: Medicare Other | Admitting: *Deleted

## 2012-07-15 ENCOUNTER — Encounter: Payer: Self-pay | Admitting: Neurosurgery

## 2012-07-15 ENCOUNTER — Ambulatory Visit (INDEPENDENT_AMBULATORY_CARE_PROVIDER_SITE_OTHER): Payer: Medicare Other | Admitting: Neurosurgery

## 2012-07-15 VITALS — BP 117/61 | HR 62 | Resp 16 | Ht 63.0 in | Wt 147.0 lb

## 2012-07-15 DIAGNOSIS — I6529 Occlusion and stenosis of unspecified carotid artery: Secondary | ICD-10-CM

## 2012-07-15 NOTE — Progress Notes (Signed)
[facility] [place] Carotid Office Note  CC: Carotid surveillance Referring Physician: Fields  History of Present Illness: 79 year old female patient of Dr. Darrick Penna followed for known carotid stenosis. The patient seen today with a Child psychotherapist from the [facility] where she resides. The patient denies any signs or symptoms of CVA, TIA, amaurosis fugax or any neural deficit.  Past Medical History  Diagnosis Date  . Mitral valve insufficiency and aortic valve insufficiency     mild mitral regurgitation  . Hypertension   . Hyperlipidemia   . Coronary atherosclerosis of native coronary artery 2006    CABG in 2006  . Anemia, unspecified   . Dementia     Severe  . Closed intertrochanteric fracture of left hip     Status post ORIF  . Carotid artery disease     60-79% left ICA stenosis and 40-59% right internal carotid artery stenosis.   . Sepsis secondary to UTI     Admitted January 2013  . Irregular heart beat     ROS: [x]  Positive   [ ]  Denies    General: [ ]  Weight loss, [ ]  Fever, [ ]  chills Neurologic: [ ]  Dizziness, [ ]  Blackouts, [ ]  Seizure [ ]  Stroke, [ ]  "Mini stroke", [ ]  Slurred speech, [ ]  Temporary blindness; [ ]  weakness in arms or legs, [ ]  Hoarseness Cardiac: [ ]  Chest pain/pressure, [ ]  Shortness of breath at rest [ ]  Shortness of breath with exertion, [ ]  Atrial fibrillation or irregular heartbeat Vascular: [ ]  Pain in legs with walking, [ ]  Pain in legs at rest, [ ]  Pain in legs at night,  [ ]  Non-healing ulcer, [ ]  Blood clot in vein/DVT,   Pulmonary: [ ]  Home oxygen, [ ]  Productive cough, [ ]  Coughing up blood, [ ]  Asthma,  [ ]  Wheezing Musculoskeletal:  [ ]  Arthritis, [ ]  Low back pain, [ ]  Joint pain Hematologic: [ ]  Easy Bruising, [ ]  Anemia; [ ]  Hepatitis Gastrointestinal: [ ]  Blood in stool, [ ]  Gastroesophageal Reflux/heartburn, [ ]  Trouble swallowing Urinary: [ ]  chronic Kidney disease, [ ]  on HD - [ ]  MWF or [ ]  TTHS, [ ]  Burning  with urination, [ ]  Difficulty urinating Skin: [ ]  Rashes, [ ]  Wounds Psychological: [ ]  Anxiety, [ ]  Depression   Social History History  Substance Use Topics  . Smoking status: Never Smoker   . Smokeless tobacco: Never Used  . Alcohol Use: No    Family History Family History  Problem Relation Age of Onset  . Coronary artery disease      FAMILY HISTORY    No Known Allergies  Current Outpatient Prescriptions  Medication Sig Dispense Refill  . atorvastatin (LIPITOR) 10 MG tablet Take 10 mg by mouth daily.      . Calcium Carbonate-Vitamin D (OSCAL 500/200 D-3 PO) Take by mouth daily.      . Cyanocobalamin (VITAMIN B 12 PO) Take by mouth.      . memantine (NAMENDA) 5 MG tablet Take 5 mg by mouth 2 (two) times daily.      . metoprolol succinate (TOPROL-XL) 25 MG 24 hr tablet Take 25 mg by mouth daily.      . Multiple Vitamins-Minerals (MULTI COMPLETE PO) Take by mouth daily.      Marland Kitchen omeprazole (PRILOSEC) 20 MG capsule Take 20 mg by mouth daily.      Marland Kitchen amLODipine (NORVASC) 5 MG tablet Take 5 mg by mouth daily.      Marland Kitchen  Bisacodyl (DULCOLAX PO) Take by mouth daily.      . megestrol (MEGACE) 40 MG/ML suspension Take 200 mg by mouth as directed.      . Nebulizer MISC by Does not apply route as directed.      . risperiDONE (RISPERDAL) 1 MG tablet Take 1 mg by mouth 2 (two) times daily.        Physical Examination  Filed Vitals:   07/15/12 1100  BP: 117/61  Pulse: 62  Resp:     Body mass index is 26.04 kg/(m^2).  General:  WDWN in NAD Gait: Normal HEENT: WNL Eyes: Pupils equal Pulmonary: normal non-labored breathing , without Rales, rhonchi,  wheezing Cardiac: RRR, without  Murmurs, rubs or gallops; Abdomen: soft, NT, no masses Skin: no rashes, ulcers noted  Vascular Exam Pulses: 3+ radial pulses bilaterally Carotid bruits: Carotid pulses to auscultation no bruits are heard Extremities without ischemic changes, no Gangrene , no cellulitis; no open wounds;    Musculoskeletal: no muscle wasting or atrophy   Neurologic: A&O X 3; Appropriate Affect ; SENSATION: normal; MOTOR FUNCTION:  moving all extremities equally. Speech is fluent/normal  Non-Invasive Vascular Imaging CAROTID DUPLEX 07/15/2012  Right ICA 20 - 39 % stenosis Left ICA 20 - 39 % stenosis   ASSESSMENT/PLAN: Asymptomatic patient with a decrease in her carotid stenosis on duplex from September 2010. The patient will followup in one year with repeat carotid duplex. The patient's questions were encouraged and answered, she is in agreement with this plan.  Lauree Chandler ANP   Clinic MD: Darrick Penna

## 2012-07-16 NOTE — Addendum Note (Signed)
Addended by: MCCHESNEY, MARILYN K on: 07/16/2012 08:41 AM   Modules accepted: Orders  

## 2012-12-07 IMAGING — CR DG FEMUR 2V*L*
4 series · 4 of 4 positions shown · non-contrast
Comparison: Pelvis 06/02/2011

CLINICAL DATA: Left hip pain after falling today.

LEFT FEMUR - 2 VIEW

[view not recorded (1 of 4)]
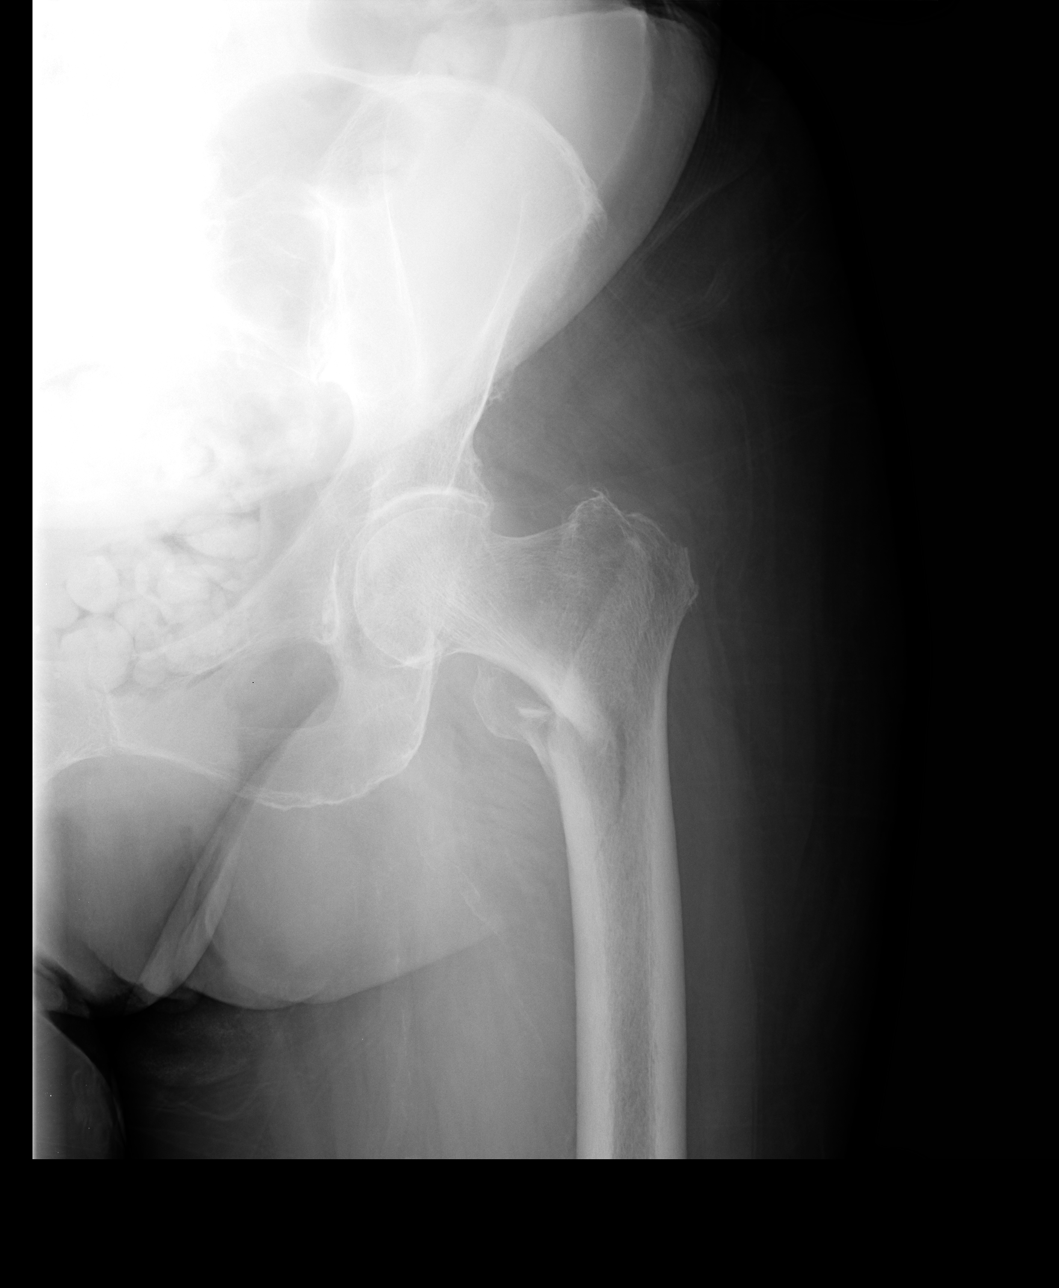

[view not recorded (2 of 4)]
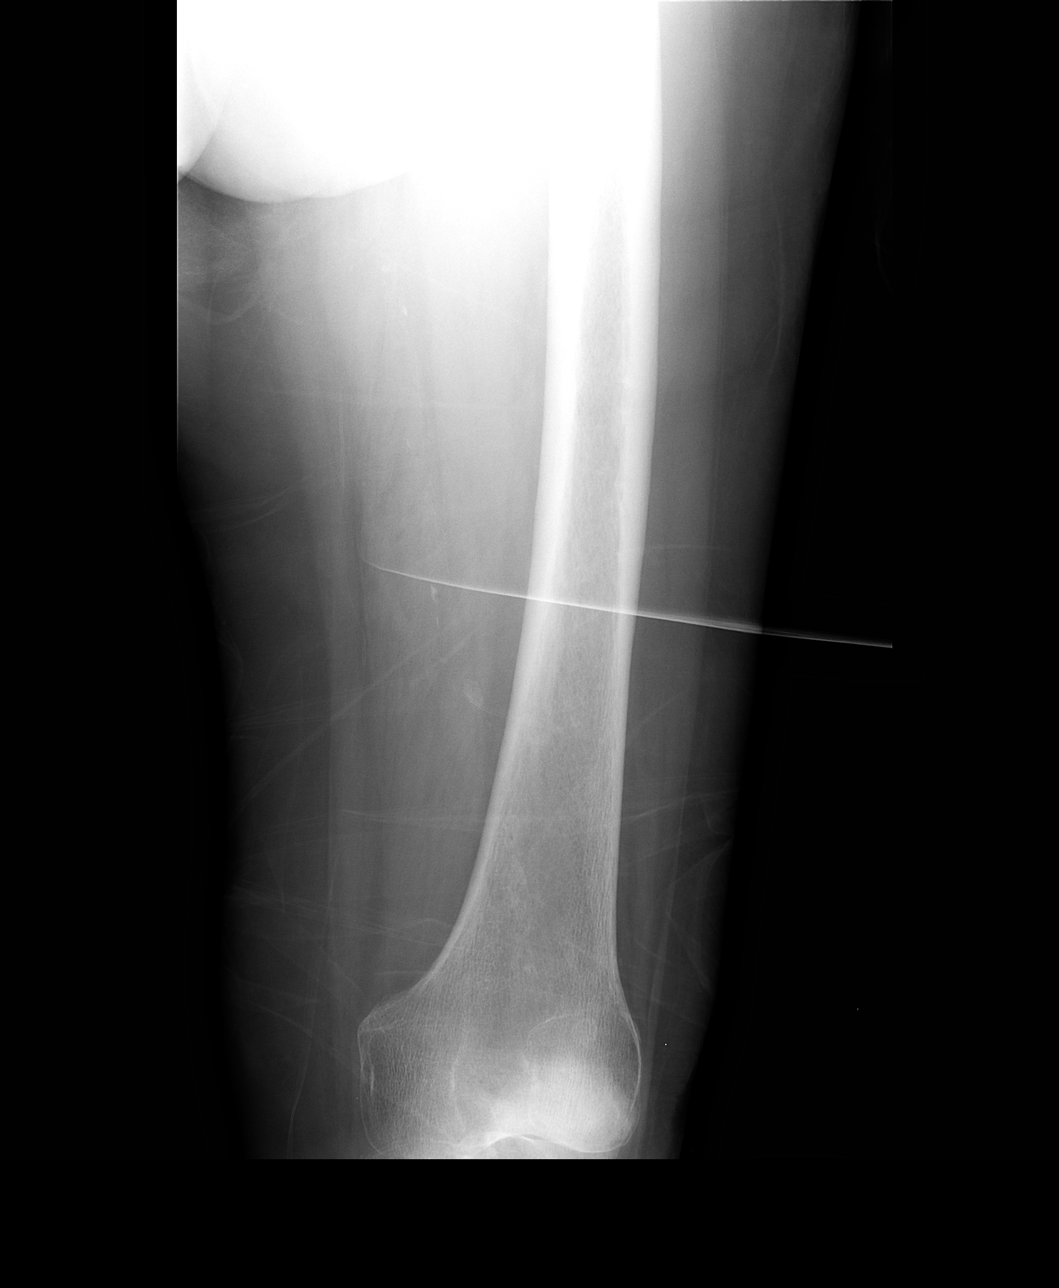

[view not recorded (3 of 4)]
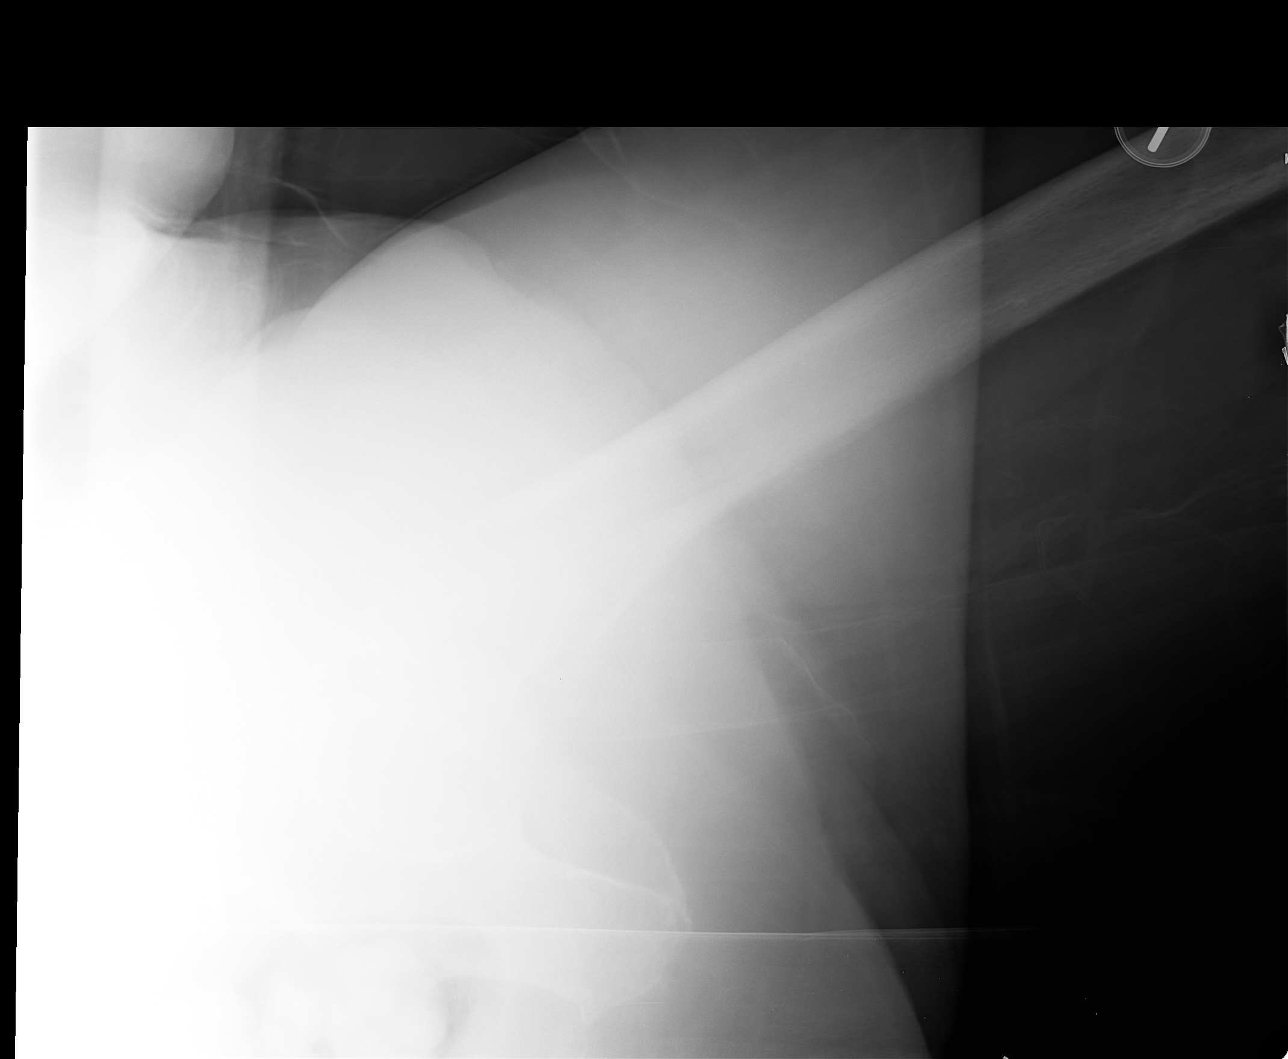

[view not recorded (4 of 4)]
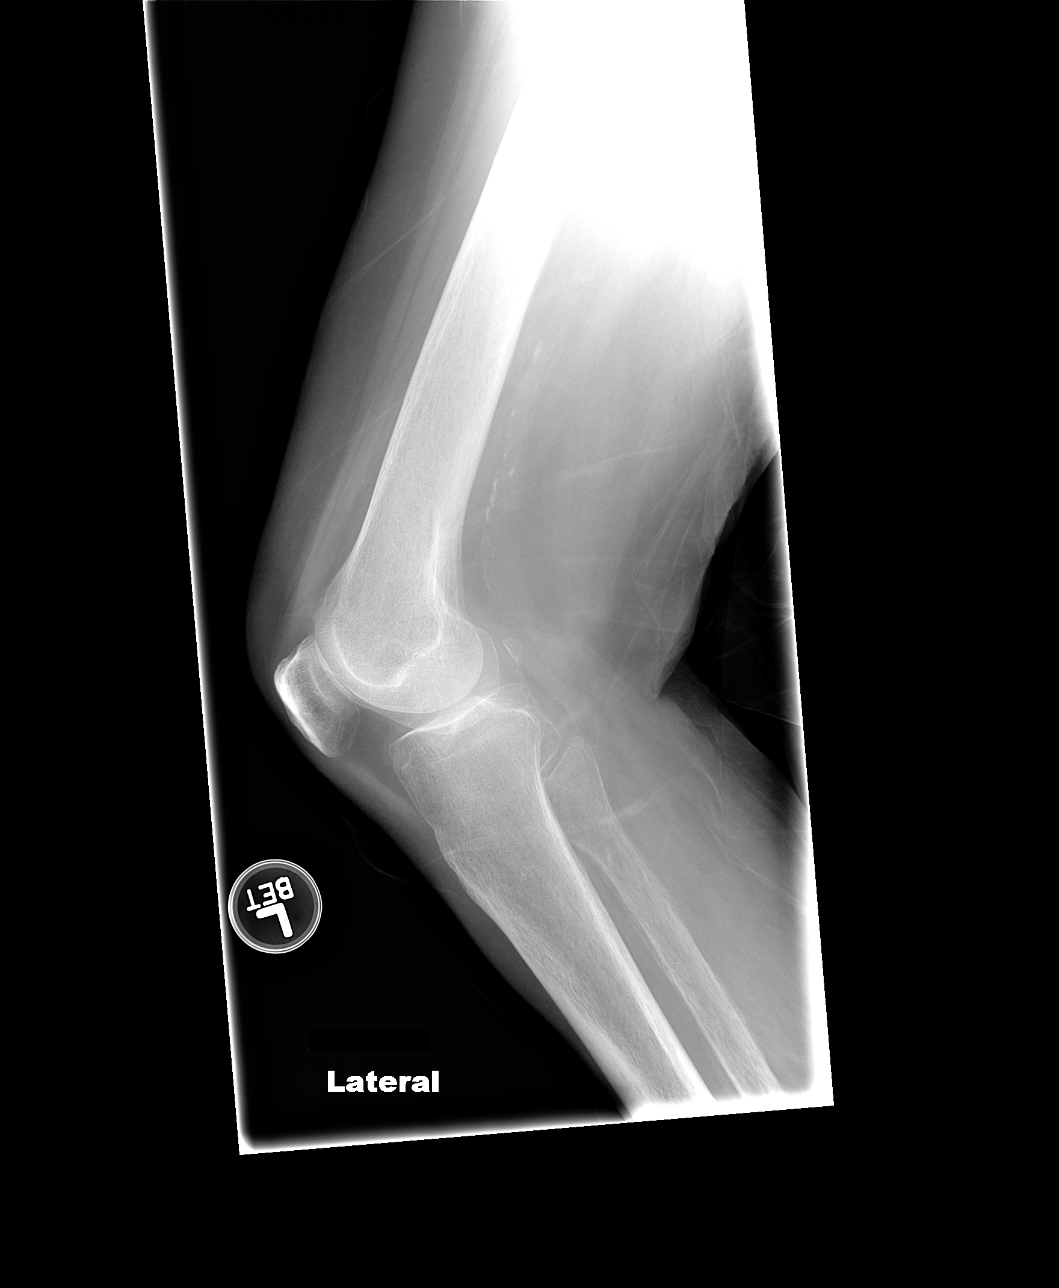

[4 of 4 positions shown; findings below may reference images not displayed]

FINDINGS: There is a comminuted intertrochanteric fracture of the
left hip.  There is associated varus angulation.  The femoral head
is located within the acetabulum based on the lateral view.
Degenerative changes are seen in the knee.  There is
atherosclerotic calcification of the superficial femoral artery.
No other fractures are identified.
IMPRESSION: Comminuted intertrochanteric fracture of the left hip.

## 2012-12-09 IMAGING — RF DG HIP OPERATIVE*L*
1 series · 3 of 3 positions shown · non-contrast
Comparison: 06/02/2011

CLINICAL DATA: Left hip fracture.

OPERATIVE LEFT HIP

[Series 1: run · 3 of 3 slices shown]
[im 1/3]
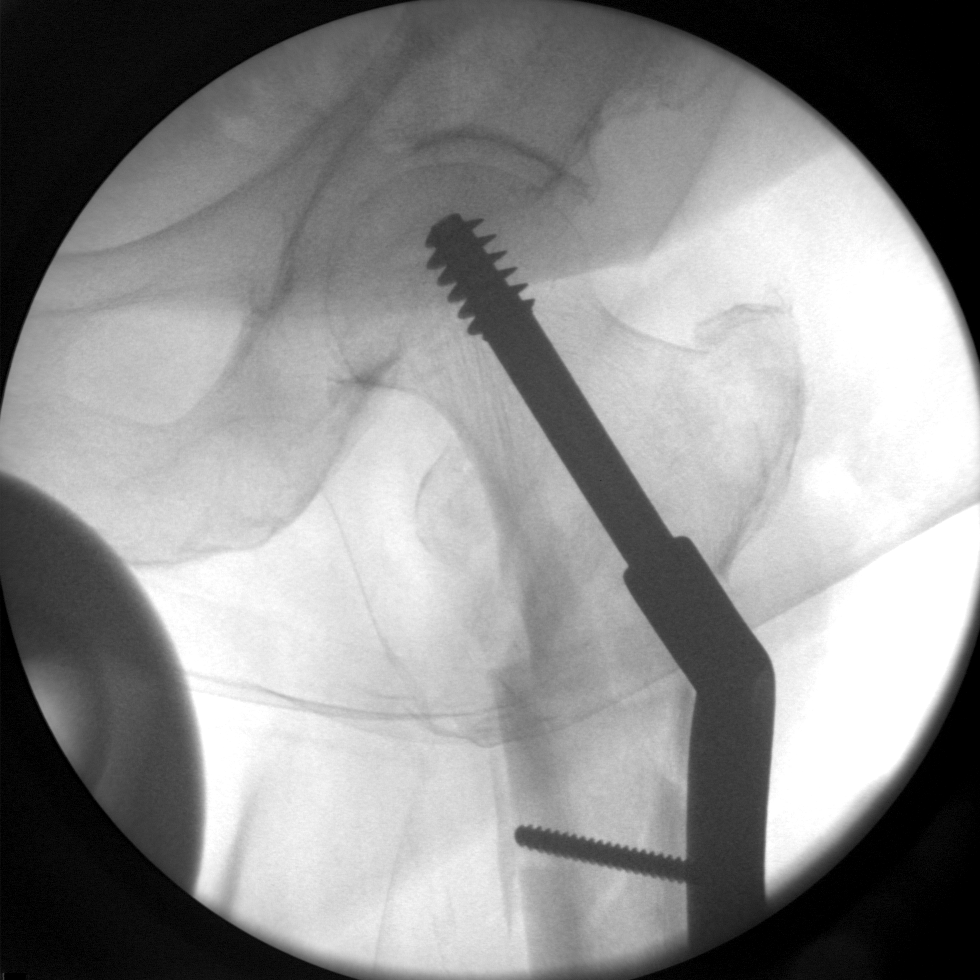
[im 2/3]
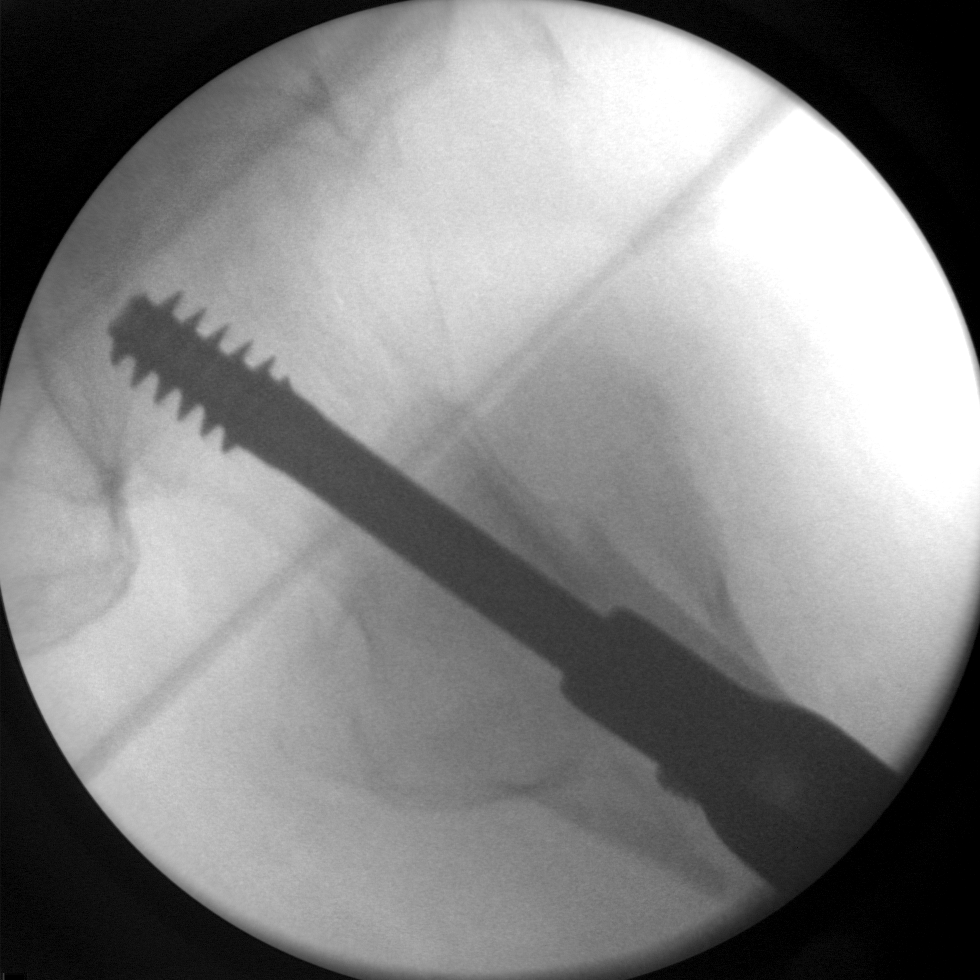
[im 3/3]
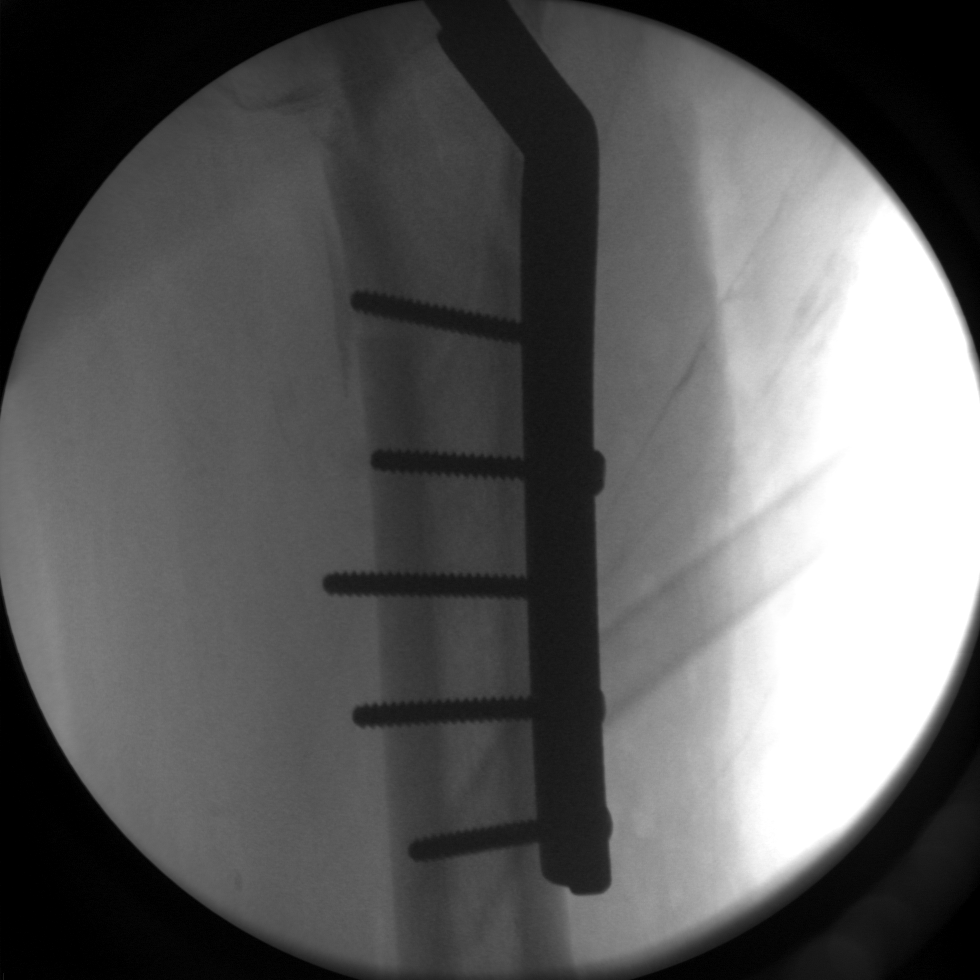

[3 of 3 positions shown; findings below may reference images not displayed]

FINDINGS: Three intraoperative spot images demonstrates internal
fixation of the left intertrochanteric fracture with lateral plate
and dynamic hip screw.  Near anatomic alignment.
IMPRESSION: Internal fixation of the left intertrochanteric fracture. No
complicating feature.

## 2013-07-20 ENCOUNTER — Encounter: Payer: Self-pay | Admitting: Vascular Surgery

## 2013-07-21 ENCOUNTER — Ambulatory Visit (INDEPENDENT_AMBULATORY_CARE_PROVIDER_SITE_OTHER): Payer: Medicare Other | Admitting: Family

## 2013-07-21 ENCOUNTER — Ambulatory Visit (HOSPITAL_COMMUNITY)
Admission: RE | Admit: 2013-07-21 | Discharge: 2013-07-21 | Disposition: A | Discharge status: Home / Self Care | Payer: Medicare Other | Source: Ambulatory Visit | Attending: Family | Admitting: Family

## 2013-07-21 ENCOUNTER — Encounter: Payer: Self-pay | Admitting: Family

## 2013-07-21 VITALS — BP 134/82 | HR 90 | Resp 16 | Ht 63.0 in | Wt 147.0 lb

## 2013-07-21 DIAGNOSIS — I658 Occlusion and stenosis of other precerebral arteries: Secondary | ICD-10-CM | POA: Insufficient documentation

## 2013-07-21 DIAGNOSIS — I6529 Occlusion and stenosis of unspecified carotid artery: Secondary | ICD-10-CM

## 2013-07-21 NOTE — Patient Instructions (Signed)

## 2013-07-21 NOTE — Progress Notes (Signed)
Established Carotid Patient  History of Present Illness  Karen Fowler is a 80 y.o. female patient of Dr. Darrick Penna followed for known carotid stenosis. The patient seen today with a caretaker from the [facility] where she resides.  Patient has not had previous carotid artery intervention.  No documented history of stroke or TIA, does have documented dementia. Caretaker states that pt does not walk, unknown reason, but she does pivot; she does not bathe self but does feed self with assist prn. Caretaker states the state assumed her guardianship and since then she has been at the [facility] and she has improved a great deal. She did not talk before coming to the center.  Caretaker denies any known New Medical or Surgical History.  Pt Diabetic: No Pt smoker: non-smoker  Pt meds include: Statin : Yes Betablocker: Yes ASA: Yes Other anticoagulants/antiplatelets: no   Past Medical History  Diagnosis Date  . Mitral valve insufficiency and aortic valve insufficiency     mild mitral regurgitation  . Hypertension   . Hyperlipidemia   . Coronary atherosclerosis of native coronary artery 2006    CABG in 2006  . Anemia, unspecified   . Dementia     Severe  . Closed intertrochanteric fracture of left hip     Status post ORIF  . Carotid artery disease     60-79% left ICA stenosis and 40-59% right internal carotid artery stenosis.   . Sepsis secondary to UTI     Admitted January 2013  . Irregular heart beat     Social History History  Substance Use Topics  . Smoking status: Never Smoker   . Smokeless tobacco: Never Used  . Alcohol Use: No    Family History Family History  Problem Relation Age of Onset  . Coronary artery disease      FAMILY HISTORY    Surgical History Past Surgical History  Procedure Laterality Date  . Coronary artery bypass graft  2006    Dr. Barry Dienes  . Abdominal hysterectomy    . Orif hip fracture  06/04/2011    Procedure: OPEN REDUCTION  INTERNAL FIXATION HIP;  Surgeon: Darreld Mclean;  Location: [facility];  Service: Orthopedics;  Laterality: Left;  . Fracture surgery  06/09/11    Hip     No Known Allergies  Current Outpatient Prescriptions  Medication Sig Dispense Refill  . amLODipine (NORVASC) 5 MG tablet Take 5 mg by mouth daily.      Marland Kitchen atorvastatin (LIPITOR) 10 MG tablet Take 10 mg by mouth daily.      . Bisacodyl (DULCOLAX PO) Take by mouth daily.      . Calcium Carbonate-Vitamin D (OSCAL 500/200 D-3 PO) Take by mouth daily.      . Cyanocobalamin (VITAMIN B 12 PO) Take by mouth.      . megestrol (MEGACE) 40 MG/ML suspension Take 200 mg by mouth as directed.      . memantine (NAMENDA) 5 MG tablet Take 5 mg by mouth 2 (two) times daily.      . metoprolol succinate (TOPROL-XL) 25 MG 24 hr tablet Take 25 mg by mouth daily.      . Multiple Vitamins-Minerals (MULTI COMPLETE PO) Take by mouth daily.      . Nebulizer MISC by Does not apply route as directed.      Marland Kitchen omeprazole (PRILOSEC) 20 MG capsule Take 20 mg by mouth daily.      . risperiDONE (RISPERDAL) 1 MG tablet Take 1 mg by  mouth 2 (two) times daily.       No current facility-administered medications for this visit.    Review of Systems : See HPI for pertinent positives and negatives.  Physical Examination  Filed Vitals:   07/21/13 0954  BP: 134/82  Pulse: 90  Resp: 16   Filed Weights   07/21/13 0954  Weight: 147 lb (66.679 kg)   Body mass index is 26.05 kg/(m^2).  General: WDWN female in NAD. Oriented to person only. GAIT: in wheelchair Eyes: Pupils equal, no gross abnormality Pulmonary:  CTAB, Negative  Rales, Negative rhonchi, & Negative wheezing.  Cardiac: regular Rhythm with frequent premature beats,  Negative detected Murmurs.  VASCULAR EXAM Carotid Bruits Left Right   Negative Negative    Aorta is not palpable. Radial pulses are 1+ palpable and  equal.                                                                                                                             LE Pulses LEFT RIGHT       POPLITEAL  not palpable   not palpable       POSTERIOR TIBIAL  not palpable   not palpable        DORSALIS PEDIS      ANTERIOR TIBIAL not palpable  not palpable     Gastrointestinal: soft, nontender, BS WNL, no r/g,  negative masses.  Musculoskeletal: Negative muscle atrophy/wasting. M/S 4/5 in UE's, 3/5 in LE's, Extremities without ischemic changes.  Neurologic: A&O X 1 Appropriate Affect ; SENSATION ;normal;  Speech is normal, responds to commands appropriately.  CN 2-12 intact, Pain and light touch intact in extremities, Motor exam as listed above.   Non-Invasive Vascular Imaging CAROTID DUPLEX 07/21/2013   Right ICA: <40% stenosis. Left ICA: <40% stenosis.  These findings are Unchanged from previous exam.  Assessment: Karen Fowler is a 80 y.o. female who presents with asymptomatic minimal bilateral ICA stenosis.  The  ICA stenosis is  Unchanged from previous exam. The last two carotid Duplex have shown <40% bilateral ICA stenosis.  Plan:  Follow-up in 2 years with Carotid Duplex scan.  The patient's caregiver was given information about stroke prevention and what symptoms should prompt the [facility] to seek immediate medical care.  Thank you for allowing us to participate in this patient's care.  Charisse MarchSuzanne Eamon Tantillo, RN, MSN, FNP-C [facility] [place] Office: [phone]  Clinic Physician: Darrick PennaFields  07/21/2013 9:28 AM

## 2013-07-28 ENCOUNTER — Encounter: Payer: Self-pay | Admitting: Cardiology

## 2015-08-10 ENCOUNTER — Encounter: Payer: Self-pay | Admitting: Family

## 2015-08-17 ENCOUNTER — Ambulatory Visit (HOSPITAL_COMMUNITY)
Admission: RE | Admit: 2015-08-17 | Discharge: 2015-08-17 | Disposition: A | Discharge status: Home / Self Care | Payer: Medicare Other | Source: Ambulatory Visit | Attending: Family | Admitting: Family

## 2015-08-17 ENCOUNTER — Other Ambulatory Visit: Payer: Self-pay | Admitting: Family

## 2015-08-17 ENCOUNTER — Ambulatory Visit (INDEPENDENT_AMBULATORY_CARE_PROVIDER_SITE_OTHER): Payer: Medicare Other | Admitting: Family

## 2015-08-17 ENCOUNTER — Encounter: Payer: Self-pay | Admitting: Family

## 2015-08-17 VITALS — BP 98/58 | HR 79 | Temp 97.7°F | Resp 12 | Ht 63.0 in | Wt 147.0 lb

## 2015-08-17 DIAGNOSIS — I499 Cardiac arrhythmia, unspecified: Secondary | ICD-10-CM | POA: Diagnosis not present

## 2015-08-17 DIAGNOSIS — I6523 Occlusion and stenosis of bilateral carotid arteries: Secondary | ICD-10-CM | POA: Insufficient documentation

## 2015-08-17 DIAGNOSIS — I6522 Occlusion and stenosis of left carotid artery: Secondary | ICD-10-CM

## 2015-08-17 DIAGNOSIS — I6521 Occlusion and stenosis of right carotid artery: Secondary | ICD-10-CM | POA: Diagnosis not present

## 2015-08-17 DIAGNOSIS — Z8669 Personal history of other diseases of the nervous system and sense organs: Secondary | ICD-10-CM

## 2015-08-17 DIAGNOSIS — I748 Embolism and thrombosis of other arteries: Secondary | ICD-10-CM | POA: Diagnosis not present

## 2015-08-17 DIAGNOSIS — Z8673 Personal history of transient ischemic attack (TIA), and cerebral infarction without residual deficits: Secondary | ICD-10-CM

## 2015-08-17 NOTE — Patient Instructions (Signed)
Stroke Prevention Some medical conditions and behaviors are associated with an increased chance of having a stroke. You may prevent a stroke by making healthy choices and managing medical conditions. HOW CAN I REDUCE MY RISK OF HAVING A STROKE?   Stay physically active. Get at least 30 minutes of activity on most or all days.  Do not smoke. It may also be helpful to avoid exposure to secondhand smoke.  Limit alcohol use. Moderate alcohol use is considered to be:  No more than 2 drinks per day for men.  No more than 1 drink per day for nonpregnant women.  Eat healthy foods. This involves:  Eating 5 or more servings of fruits and vegetables a day.  Making dietary changes that address high blood pressure (hypertension), high cholesterol, diabetes, or obesity.  Manage your cholesterol levels.  Making food choices that are high in fiber and low in saturated fat, trans fat, and cholesterol may control cholesterol levels.  Take any prescribed medicines to control cholesterol as directed by your health care provider.  Manage your diabetes.  Controlling your carbohydrate and sugar intake is recommended to manage diabetes.  Take any prescribed medicines to control diabetes as directed by your health care provider.  Control your hypertension.  Making food choices that are low in salt (sodium), saturated fat, trans fat, and cholesterol is recommended to manage hypertension.  Ask your health care provider if you need treatment to lower your blood pressure. Take any prescribed medicines to control hypertension as directed by your health care provider.  If you are 18-39 years of age, have your blood pressure checked every 3-5 years. If you are 40 years of age or older, have your blood pressure checked every year.  Maintain a healthy weight.  Reducing calorie intake and making food choices that are low in sodium, saturated fat, trans fat, and cholesterol are recommended to manage  weight.  Stop drug abuse.  Avoid taking birth control pills.  Talk to your health care provider about the risks of taking birth control pills if you are over 35 years old, smoke, get migraines, or have ever had a blood clot.  Get evaluated for sleep disorders (sleep apnea).  Talk to your health care provider about getting a sleep evaluation if you snore a lot or have excessive sleepiness.  Take medicines only as directed by your health care provider.  For some people, aspirin or blood thinners (anticoagulants) are helpful in reducing the risk of forming abnormal blood clots that can lead to stroke. If you have the irregular heart rhythm of atrial fibrillation, you should be on a blood thinner unless there is a good reason you cannot take them.  Understand all your medicine instructions.  Make sure that other conditions (such as anemia or atherosclerosis) are addressed. SEEK IMMEDIATE MEDICAL CARE IF:   You have sudden weakness or numbness of the face, arm, or leg, especially on one side of the body.  Your face or eyelid droops to one side.  You have sudden confusion.  You have trouble speaking (aphasia) or understanding.  You have sudden trouble seeing in one or both eyes.  You have sudden trouble walking.  You have dizziness.  You have a loss of balance or coordination.  You have a sudden, severe headache with no known cause.  You have new chest pain or an irregular heartbeat. Any of these symptoms may represent a serious problem that is an emergency. Do not wait to see if the symptoms will   go away. Get medical help at once. Call your local emergency services (911 in [place] Do not drive yourself to the hospital.   This information is not intended to replace advice given to you by your health care provider. Make sure you discuss any questions you have with your health care provider.   Document Released: 07/31/2004 Document Revised: 07/14/2014 Document Reviewed:  12/24/2012 Elsevier Interactive Patient Education 2016 [employer]  

## 2015-08-17 NOTE — Progress Notes (Signed)
Chief Complaint: Extracranial Carotid Artery Stenosis   History of Present Illness  Karen Fowler is a 82 y.o. female patient of Dr. Darrick Penna followed for known minimal extracranial carotid artery stenosis. The patient is seen today with social worker/legal guardian from the [facility] where she resides.  Patient has not had previous carotid artery intervention.  Her legal guardian/social worker states pt had a stroke on 02/01/15 as manifested by left hemiparesis, pt had stopped talking. Pt started talking again about 3 days after the stroke, left hemiparesis remains. Social worker states pt did receive physical therapy, occupational therapy, and speech therapy. 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 She was evaluated at [facility] for this.   Pt does have documented dementia. Pt did not walk before her stroke, unknown reason, but she did pivot; she did not bathe self but did feed self with assist prn. Caretaker states the state assumed her guardianship and since then she has been at the [facility] and she has improved a great deal. She did not talk before coming to the center.  Pt Diabetic: No Pt smoker: non-smoker  Pt meds include: Statin : Yes Betablocker: Yes ASA: Yes Other anticoagulants/antiplatelets: no    Past Medical History  Diagnosis Date  . Mitral valve insufficiency and aortic valve insufficiency     mild mitral regurgitation  . Hypertension   . Hyperlipidemia   . Coronary atherosclerosis of native coronary artery 2006    CABG in 2006  . Anemia, unspecified   . Dementia     Severe  . Closed intertrochanteric fracture of left hip     Status post ORIF  . Carotid artery disease     60-79% left ICA stenosis and 40-59% right internal carotid artery stenosis.   . Sepsis secondary to UTI     Admitted January 2013  . Irregular heart beat     Social History Social History   Substance Use Topics  . Smoking status: Never Smoker   . Smokeless tobacco: Never Used  . Alcohol Use: No    Family History Family History  Problem Relation Age of Onset  . Coronary artery disease      FAMILY HISTORY    Surgical History Past Surgical History  Procedure Laterality Date  . Coronary artery bypass graft  2006    Dr. Barry Dienes  . Abdominal hysterectomy    . Orif hip fracture  06/04/2011    Procedure: OPEN REDUCTION INTERNAL FIXATION HIP;  Surgeon: Darreld Mclean;  Location: [facility];  Service: Orthopedics;  Laterality: Left;  . Fracture surgery  06/09/11    Hip     No Known Allergies  Current Outpatient Prescriptions  Medication Sig Dispense Refill  . albuterol (PROVENTIL) (2.5 MG/3ML) 0.083% nebulizer solution Take 2.5 mg by nebulization every 6 (six) hours as needed for wheezing or shortness of breath.    . allopurinol (ZYLOPRIM) 300 MG tablet     . ALPRAZolam (XANAX) 0.5 MG tablet Take 0.5 mg by mouth at bedtime as needed for anxiety.    Marland Kitchen amLODipine (NORVASC) 5 MG tablet Take 5 mg by mouth daily.    Marland Kitchen aspirin 81 MG tablet Take 81 mg by mouth daily.    Marland Kitchen atorvastatin (LIPITOR) 10 MG tablet Take 10 mg by mouth daily.    . Bisacodyl (DULCOLAX PO) Take by mouth daily.    . Calcium Carbonate-Vitamin D (OSCAL 500/200 D-3 PO) Take  by mouth daily.    . Cyanocobalamin (VITAMIN B 12 PO) Take by mouth.    . doxycycline (VIBRAMYCIN) 100 MG capsule Take 100 mg by mouth 2 (two) times daily.    Marland Kitchen HYDROcodone-acetaminophen (NORCO/VICODIN) 5-325 MG per tablet Take 1 tablet by mouth every 6 (six) hours as needed for moderate pain.    . megestrol (MEGACE) 40 MG/ML suspension Take 200 mg by mouth as directed.    . memantine (NAMENDA) 5 MG tablet Take 5 mg by mouth 2 (two) times daily.    . metoprolol succinate (TOPROL-XL) 25 MG 24 hr tablet Take 25 mg by mouth daily.    . metoprolol tartrate (LOPRESSOR) 25 MG tablet Take 25 mg by mouth 2 (two) times daily.    . Multiple  Vitamins-Minerals (MULTI COMPLETE PO) Take by mouth daily.    . Nebulizer MISC by Does not apply route as directed.    . nitroGLYCERIN (NITROSTAT) 0.4 MG SL tablet Place 0.4 mg under the tongue every 5 (five) minutes as needed for chest pain.    Marland Kitchen omeprazole (PRILOSEC) 20 MG capsule Take 20 mg by mouth daily.    . risperiDONE (RISPERDAL) 1 MG tablet Take 1 mg by mouth 2 (two) times daily.     No current facility-administered medications for this visit.    Review of Systems : See HPI for pertinent positives and negatives.  Physical Examination  Filed Vitals:   08/17/15 1205  BP: 98/58  Pulse: 79  Temp: 97.7 F (36.5 C)  Resp: 12  Height:  (1.6 m)  Weight: 147 lb (66.679 kg)  SpO2: 98%   Body mass index is 26.05 kg/(m^2).   General: WDWN female in NAD. Oriented to person only. Leaning to her left. GAIT: in wheelchair Eyes: Pupils equal, no gross abnormality Pulmonary: CTAB, respirations are non labored.  Cardiac: Irregular rhythm,no detected murmur.  VASCULAR EXAM Carotid Bruits Left Right   Negative Negative   Aorta is not palpable. Radial pulses are 1+ palpable andequal.      LE Pulses LEFT RIGHT   POPLITEAL not palpable  not palpable   POSTERIOR TIBIAL not palpable  not palpable    DORSALIS PEDIS  ANTERIOR TIBIAL not palpable  not palpable     Gastrointestinal: soft, nontender, BS WNL, no r/g, no palpable masses.  Musculoskeletal: Left upper extremity and lower extrmity muscle atrophy/wasting. M/S 4/5 in right UE's, 3/5 in  Right LE, flaccid left upper and lower extremities, Extremities without ischemic changes.  Neurologic: A&O X 1 Appropriate Affect, Speech is normal, responds to commands appropriately.  CN 2-12 intact except left facial droop, Pain and light touch intact in  extremities, Motor exam as listed above.         Non-Invasive Vascular Imaging CAROTID DUPLEX 08/17/2015   CEREBROVASCULAR DUPLEX EVALUATION    INDICATION: Follow-up carotid disease     PREVIOUS INTERVENTION(S):     DUPLEX EXAM:     RIGHT  LEFT  Peak Systolic Velocities (cm/s) End Diastolic Velocities (cm/s) Plaque LOCATION Peak Systolic Velocities (cm/s) End Diastolic Velocities (cm/s) Plaque  79 0 HT CCA PROXIMAL 95 25   82 0 HT CCA MID 112 24 HT  71 0 HT CCA DISTAL 112 30 HT  118 0 HT ECA 397 0 HT  Occluded  HM/HT ICA PROXIMAL 181 42 HT  Occluded   ICA MID 86 29   NV   ICA DISTAL 132 28     NA ICA / CCA Ratio (PSV) 1.6  Antegrade  Vertebral Flow Dampened antegrade    Brachial Systolic Pressure (mmHg)   Within normal limits  Subclavian Artery Waveforms Occluded proximally     Plaque Morphology:  HM = Homogeneous, HT = Heterogeneous, CP = Calcific Plaque, SP = Smooth Plaque, IP = Irregular Plaque     ADDITIONAL FINDINGS:     IMPRESSION: 1. Evidence of occluded right internal carotid artery. 2. Velocities suggest 40%-59% left internal carotid artery stenosis; however, this is likely overestimated due to compensatory flow. 3. Right vertebral artery and subclavian artery are within normal limits. 4. Left vertebral artery is dampened, left subclavian artery appears proximally occluded with distal reconstitution and monophasic waveforms.    Compared to the previous exam:  Significant disease progression compared to exam 2 years ago.      Assessment: ETOSHA WETHERELL is a 82 y.o. female who had a stroke in July 2016 with left hemiparesis.  She is a resident of the [facility] and a Child psychotherapist is her assigned legal guardian.  The previous two carotid duplex exams have shown <40% bilateral ICA stenosis. Today's carotid duplex shows an occluded right internal carotid artery and 40%-59% left internal carotid artery stenosis; however, this is likely overestimated due to  compensatory flow.  Significant disease progression compared to exam 2 years ago.   I discussed with Dr. Imogene Burn pt status, irregular heart rhythm, EKG is needed to determine heart rhythm, results of today's carotid duplex. Her list of medications does not include an anticoagulant; the list does include a daily ASA, statin, and beta blocker.   Plan: Follow-up in 6 months with Carotid Duplex scan.   I discussed in depth with the patient the nature of atherosclerosis, and emphasized the importance of maximal medical management including strict control of blood pressure, blood glucose, and lipid levels, obtaining regular exercise, and continued cessation of smoking.  The patient is aware that without maximal medical management the underlying atherosclerotic disease process will progress, limiting the benefit of any interventions. The patient was given information about stroke prevention and what symptoms should prompt the patient to seek immediate medical care. Thank you for allowing [place] to participate in this patient's care.  Charisse March, RN, MSN, FNP-C [facility] Office: [phone]  Clinic Physician: Imogene Burn  08/17/2015 11:38 AM

## 2015-08-20 NOTE — Addendum Note (Signed)
Addended by: Adria Dill L on: 08/20/2015 11:25 AM   Modules accepted: Orders

## 2016-02-14 ENCOUNTER — Encounter (HOSPITAL_COMMUNITY): Payer: Medicare Other

## 2016-02-14 ENCOUNTER — Ambulatory Visit: Payer: Medicare Other | Admitting: Family

## 2017-11-04 DEATH — deceased
